# Patient Record
Sex: Female | Born: 1960 | Race: White | Hispanic: No | Smoking: Former smoker
Health system: Southern US, Community
[De-identification: ages and names within clinical notes are randomized; demographics above are authoritative.]

## PROBLEM LIST (undated history)

## (undated) DIAGNOSIS — E079 Disorder of thyroid, unspecified: Secondary | ICD-10-CM

## (undated) DIAGNOSIS — E785 Hyperlipidemia, unspecified: Secondary | ICD-10-CM

## (undated) HISTORY — PX: BREAST SURGERY: SHX581

## (undated) HISTORY — PX: THYROIDECTOMY: SHX17

## (undated) HISTORY — DX: Hyperlipidemia, unspecified: E78.5

---

## 1992-07-13 HISTORY — PX: TUBAL LIGATION: SHX77

## 1999-06-18 ENCOUNTER — Other Ambulatory Visit: Admission: RE | Admit: 1999-06-18 | Discharge: 1999-06-18 | Payer: Self-pay | Admitting: Gynecology

## 1999-07-08 ENCOUNTER — Encounter: Payer: Self-pay | Admitting: Gynecology

## 1999-07-08 ENCOUNTER — Encounter: Admission: RE | Admit: 1999-07-08 | Discharge: 1999-07-08 | Payer: Self-pay | Admitting: Gynecology

## 2001-11-07 ENCOUNTER — Other Ambulatory Visit: Admission: RE | Admit: 2001-11-07 | Discharge: 2001-11-07 | Payer: Self-pay | Admitting: Gynecology

## 2002-01-23 ENCOUNTER — Encounter: Payer: Self-pay | Admitting: Gynecology

## 2002-01-23 ENCOUNTER — Encounter: Admission: RE | Admit: 2002-01-23 | Discharge: 2002-01-23 | Payer: Self-pay | Admitting: Gynecology

## 2003-04-26 ENCOUNTER — Encounter: Payer: Self-pay | Admitting: Gynecology

## 2003-04-26 ENCOUNTER — Encounter: Admission: RE | Admit: 2003-04-26 | Discharge: 2003-04-26 | Payer: Self-pay | Admitting: Gynecology

## 2003-04-26 ENCOUNTER — Other Ambulatory Visit: Admission: RE | Admit: 2003-04-26 | Discharge: 2003-04-26 | Payer: Self-pay | Admitting: Gynecology

## 2004-07-11 ENCOUNTER — Encounter: Admission: RE | Admit: 2004-07-11 | Discharge: 2004-07-11 | Payer: Self-pay | Admitting: Family Medicine

## 2006-03-11 ENCOUNTER — Encounter: Admission: RE | Admit: 2006-03-11 | Discharge: 2006-03-11 | Payer: Self-pay | Admitting: Family Medicine

## 2007-07-14 HISTORY — PX: COLONOSCOPY: SHX174

## 2007-10-19 ENCOUNTER — Encounter: Admission: RE | Admit: 2007-10-19 | Discharge: 2007-10-19 | Payer: Self-pay | Admitting: Family Medicine

## 2007-10-20 ENCOUNTER — Other Ambulatory Visit: Admission: RE | Admit: 2007-10-20 | Discharge: 2007-10-20 | Payer: Self-pay | Admitting: Gynecology

## 2008-03-07 ENCOUNTER — Ambulatory Visit: Payer: Self-pay | Admitting: Gastroenterology

## 2008-03-07 DIAGNOSIS — D509 Iron deficiency anemia, unspecified: Secondary | ICD-10-CM | POA: Insufficient documentation

## 2008-03-07 DIAGNOSIS — K560 Paralytic ileus: Secondary | ICD-10-CM | POA: Insufficient documentation

## 2008-03-07 LAB — CONVERTED CEMR LAB: IgA: 258 mg/dL (ref 68–378)

## 2008-03-11 ENCOUNTER — Encounter: Payer: Self-pay | Admitting: Gastroenterology

## 2008-03-11 LAB — CONVERTED CEMR LAB: Tissue Transglutaminase Ab, IgA: 0.7 units (ref <7)

## 2008-04-20 ENCOUNTER — Ambulatory Visit: Payer: Self-pay | Admitting: Gastroenterology

## 2010-08-12 NOTE — Procedures (Signed)
Summary: Colonoscopy   Colonoscopy  Procedure date:  04/20/2008  Findings:      Location:  [facility].    Procedures Next Due Date:    Colonoscopy: 04/2018  Patient Name: Michelle Finley, Michelle Finley MRN:  Procedure Procedures: Colonoscopy CPT: 82956.  Personnel: Endoscopist: Rachael Fee, MD.  Referred By: Gaetano Hawthorne Lily Peer, MD.  Exam Location: Exam performed in Endoscopy Suite. Outpatient  Patient Consent: Procedure, Alternatives, Risks and Benefits discussed, consent obtained, from patient. Consent was obtained by the RN.  Indications  Evaluation of: Anemia with low ferritin.  History  Current Medications: Patient is not currently taking Coumadin.  Comments: Patient history reviewed and updated, pre-procedure physical performed prior to initiation of sedation? yes Pre-Exam Physical: Performed Apr 20, 2008. Cardio-pulmonary exam, Abdominal exam, Mental status exam WNL.  Exam Exam: Extent of exam reached: Cecum, extent intended: Cecum.  The cecum was identified by appendiceal orifice and IC valve. Patient position: on left side. Time to Cecum: 00:02:04. Time for Withdrawl: 00:08:03. Colon retroflexion performed. Images taken. ASA Classification: II. Tolerance: good.  Monitoring: Pulse and BP monitoring, Oximetry used. Supplemental O2 given.  Colon Prep Prep results: good.  Sedation Meds: Patient assessed and found to be appropriate for moderate (conscious) sedation. Fentanyl 75 mcg. given IV. Versed 6 mg. given IV.  Findings - NORMAL EXAM: Cecum to Rectum.   Assessment Normal examination.  Comments: NORMAL EXAMINATION.  NO POLYPS OR CANCERS.  SHOULD CONTINUE TO FOLLOW CURRENT COLORECTAL CANCER SCREENING GUIDELINES WITH A REPEAT COLONOSCOPY IN 10 YEARS. Events  Unplanned Interventions: No intervention was required.  Unplanned Events: There were no complications. Plans Comments: COLONOSCOPY IN 10 YEARS.   cc: Reynaldo Minium, MD      Lupe Carney, MD   This report was created from the original endoscopy report, which was reviewed and signed by the above listed endoscopist.

## 2010-08-12 NOTE — Letter (Signed)
Summary: Results Letter  Diagnostic & Radiology Imaging     , [place]    Phone:   Fax:         March 11, 2008 MRN: [id number]    [facility] [address] [place], [place]  [address]    Dear Ms. Weedman,   Your recent blood tests were all normal.  You do not have celiac sprue.  Please continue with the recommendations we previously discussed and feel free to call if you have any further questions or concerns.       Sincerely,  Rachael Fee MD  This letter has been electronically signed by your physician.  Appended Document: Results Letter letter mailed to pt

## 2010-08-12 NOTE — Assessment & Plan Note (Signed)
History of Present Illness Visit Type: consult Primary GI MD: Rob Bunting MD Primary Provider: Lupe Carney, MD Requesting Provider: Reynaldo Minium, MD Chief Complaint: anemia History of Present Illness:     very pleasant 50 year old woman  Saw OB gyn for heavy, prolonged menstral bleeding.  Found ot have fibroids and anemia.  She was started on iron supplement (has been intermittently on them in the past many years).  She was put on megace for menstral bleeding (took for a total of 3 bleeding). Bleeding stopped.   hemoglobin was in the 80s, low MCV. Iron testing showed iron of 19, ferritin of 4.  No rectal bleeding, no significant constipation or diarrhea.  Usually goes daily.  WEight has gone up by 15-20 pounds in past year.             Prior Medications Reviewed Using: Patient Recall  Updated Prior Medication List: SYNTHROID 125 MCG TABS (LEVOTHYROXINE SODIUM) Take 1 tablet by mouth once a day IRON 325 (65 FE) MG TABS (FERROUS SULFATE) Take 1 tablet by mouth two times a day  Current Allergies (reviewed today): No known allergies   Past Medical History:    Hypochromic microcytic anemia    Hypothyroidism    fibroids in uterus    Chronic anemia  Past Surgical History:    2 C-sections in 1994 and 1990    Thyroid removed in 1995   Family History:    no colon cancer family  Social History:    she is married, she has 2 children, she is a Designer, jewellery working on the EMR system at [employer], former smoker, drinks 0-1 alcoholic beverages a day,drinks one to 2 caffeinated beverages a day.    Review of Systems       Pertinent positive and negative review of systems were noted in the above HPI and GI specific review of systems.  All other review of systems was otherwise negative.    Vital Signs:  Patient Profile:   50 Years Old Female Height:     63 inches Weight:      188 pounds BMI:     33.42 Pulse rate:   64 / minute Pulse rhythm:   regular BP sitting:    122 / 84  (left arm) Cuff size:   regular  Vitals Entered By: Francee Piccolo CMA (March 07, 2008 10:42 AM)                  Physical Exam  Constitutional: generally well appearing Psychiatric: alert and oriented times 3 Eyes: extraocular movements intact Mouth: oropharynx moist, no lesions Neck: supple, no lymphadenopathy Cardiovascular: heart regular rate and rythm Lungs: CTA bilaterally Abdomen: soft, non-tender, non-distended, no obvious ascites, no peritoneal signs, normal bowel sounds Extremities: no lower extremity edema bilaterally Skin: no lesions on visible extremities     Impression & Recommendations:  Problem # 1:  ANEMIA-IRON DEFICIENCY (ICD-280.9) I suspect her anemia is mainly from menstrual losses, fibroids. She is 50 years old however and I think it is definitely prudent to arrange for her to have a full colonoscopy at her soonest convenience, make sure she does not have occult neoplasm which is adding to her iron deficiency.  celiac sprue testing will also be done as that can cause iron deficiency anemia.  Other Orders: TLB-IgA (Immunoglobulin A) ([id number]) T-Tissue Transglutamase Ab IgA ([id number])   Patient Instructions: 1)  You will be scheduled to have a colonoscopy. 2)  Continue daily iron (once to twice a day).  3)  You will get lab test(s) done (TTG, IgA).    ]  Appended Document: Orders Update    Clinical Lists Changes  Medications: Added new medication of MOVIPREP 100 GM  SOLR (PEG-KCL-NACL-NASULF-NA ASC-C) As per prep instructions. - Signed Rx of MOVIPREP 100 GM  SOLR (PEG-KCL-NACL-NASULF-NA ASC-C) As per prep instructions.;  #1 x 0;  Signed;  Entered by: Christie Nottingham CMA;  Authorized by: Rachael Fee MD;  Method used: Electronically to [facility]*, [address] Shipping/mailing, [place], [place]  [address], Ph: [phone], Fax: [phone] Orders: Added new Test order of Colonoscopy  (Colon) - Signed    Prescriptions: MOVIPREP 100 GM  SOLR (PEG-KCL-NACL-NASULF-NA ASC-C) As per prep instructions.  #1 x 0   Entered by:   Christie Nottingham CMA   Authorized by:   Rachael Fee MD   Signed by:   Christie Nottingham CMA on 03/07/2008   Method used:   Electronically to        [facility]* (retail)       [address]       [address] Shipping/mailing       [place], [place]  [address]       Ph: [phone]       Fax: [phone]   RxID:   [id number]

## 2011-01-09 ENCOUNTER — Other Ambulatory Visit: Payer: Self-pay | Admitting: Gynecology

## 2011-01-09 DIAGNOSIS — Z1231 Encounter for screening mammogram for malignant neoplasm of breast: Secondary | ICD-10-CM

## 2011-02-03 ENCOUNTER — Ambulatory Visit
Admission: RE | Admit: 2011-02-03 | Discharge: 2011-02-03 | Disposition: A | Discharge status: Home / Self Care | Payer: Commercial Managed Care - PPO | Source: Ambulatory Visit | Attending: Gynecology | Admitting: Gynecology

## 2011-02-03 DIAGNOSIS — Z1231 Encounter for screening mammogram for malignant neoplasm of breast: Secondary | ICD-10-CM

## 2011-06-11 ENCOUNTER — Emergency Department (HOSPITAL_COMMUNITY)
Admission: EM | Admit: 2011-06-11 | Discharge: 2011-06-11 | Disposition: A | Discharge status: Home / Self Care | Payer: Commercial Managed Care - PPO | Source: Home / Self Care | Attending: Emergency Medicine | Admitting: Emergency Medicine

## 2011-06-11 ENCOUNTER — Encounter: Payer: Self-pay | Admitting: *Deleted

## 2011-06-11 DIAGNOSIS — N3000 Acute cystitis without hematuria: Secondary | ICD-10-CM

## 2011-06-11 HISTORY — DX: Disorder of thyroid, unspecified: E07.9

## 2011-06-11 LAB — POCT URINALYSIS DIP (DEVICE)
Bilirubin Urine: NEGATIVE
Glucose, UA: NEGATIVE mg/dL
Ketones, ur: NEGATIVE mg/dL
Nitrite: POSITIVE — AB
Protein, ur: 100 mg/dL — AB
Specific Gravity, Urine: 1.02 (ref 1.005–1.030)
Urobilinogen, UA: 0.2 mg/dL (ref 0.0–1.0)
pH: 5.5 (ref 5.0–8.0)

## 2011-06-11 MED ORDER — CEPHALEXIN 500 MG PO CAPS: 500.0000 mg | ORAL_CAPSULE | Freq: Three times a day (TID) | ORAL | Status: AC

## 2011-06-11 MED ORDER — PHENAZOPYRIDINE HCL 200 MG PO TABS: 200.0000 mg | ORAL_TABLET | Freq: Three times a day (TID) | ORAL | Status: AC | PRN

## 2011-06-11 NOTE — ED Provider Notes (Signed)
History     CSN: [id number] Arrival date & time: 06/11/2011  2:40 PM   First MD Initiated Contact with Patient 06/11/11 1455      Chief Complaint  Patient presents with  . Urinary Tract Infection    (Consider location/radiation/quality/duration/timing/severity/associated sxs/prior treatment) HPI Comments: Michelle Finley has had a one-week history of low back pain, burning with urination, suprapubic pain, urinary frequency, and urgency. She denies any fever, chills, nausea, vomiting, hematuria, or GYN complaints. She has had no prior history of urinary tract infections.  Patient is a 50 y.o. female presenting with urinary tract infection.  Urinary Tract Infection Pertinent negatives include no abdominal pain.    Past Medical History  Diagnosis Date  . Thyroid disease     Past Surgical History  Procedure Date  . Cesarean section   . Thyroidectomy     Family History  Problem Relation Age of Onset  . Hypertension Mother   . Coronary artery disease Father     History  Substance Use Topics  . Smoking status: Never Smoker   . Smokeless tobacco: Not on file  . Alcohol Use: Yes     occasonally    OB History    Grav Para Term Preterm Abortions TAB SAB Ect Mult Living                  Review of Systems  Constitutional: Negative for fever and chills.  Gastrointestinal: Negative for nausea, vomiting and abdominal pain.  Genitourinary: Positive for dysuria, urgency, frequency and flank pain. Negative for hematuria, vaginal bleeding, vaginal discharge, vaginal pain, menstrual problem and pelvic pain.    Allergies  Review of patient's allergies indicates no known allergies.  Home Medications   Current Outpatient Rx  Name Route Sig Dispense Refill  . LEVOTHYROXINE SODIUM 125 MCG PO TABS Oral Take 125 mcg by mouth daily.      . CEPHALEXIN 500 MG PO CAPS Oral Take 1 capsule (500 mg total) by mouth 3 (three) times daily. 30 capsule 0  . PHENAZOPYRIDINE HCL 200 MG PO TABS Oral  Take 1 tablet (200 mg total) by mouth 3 (three) times daily as needed for pain. 15 tablet 0    BP 153/92  Pulse 80  Temp(Src) 98.5 F (36.9 C) (Oral)  Resp 17  SpO2 100%  Physical Exam  Nursing note and vitals reviewed. Constitutional: She appears well-developed and well-nourished. No distress.  Cardiovascular: Normal rate and regular rhythm.  Exam reveals no gallop and no friction rub.   No murmur heard. Pulmonary/Chest: Effort normal. No respiratory distress. She has no wheezes. She has no rales.  Abdominal: Soft. Bowel sounds are normal. She exhibits no distension and no mass. There is no hepatosplenomegaly. There is no tenderness. There is no rebound, no guarding and no CVA tenderness.  Skin: She is not diaphoretic.    ED Course  Procedures (including critical care time)  Results for orders placed during the hospital encounter of 06/11/11  POCT URINALYSIS DIP (DEVICE)      Component Value Range   Glucose, UA NEGATIVE  NEGATIVE (mg/dL)   Bilirubin Urine NEGATIVE  NEGATIVE    Ketones, ur NEGATIVE  NEGATIVE (mg/dL)   Specific Gravity, Urine 1.020  1.005 - 1.030    Hgb urine dipstick MODERATE (*) NEGATIVE    pH 5.5  5.0 - 8.0    Protein, ur 100 (*) NEGATIVE (mg/dL)   Urobilinogen, UA 0.2  0.0 - 1.0 (mg/dL)   Nitrite POSITIVE (*) NEGATIVE  Leukocytes, UA SMALL (*) NEGATIVE      Labs Reviewed  POCT URINALYSIS DIP (DEVICE) - Abnormal; Notable for the following:    Hgb urine dipstick MODERATE (*)    Protein, ur 100 (*)    Nitrite POSITIVE (*)    Leukocytes, UA SMALL (*) Biochemical Testing Only. Please order routine urinalysis from main lab if confirmatory testing is needed.   All other components within normal limits  URINE CULTURE   No results found.   1. Acute cystitis       MDM  She appears to have a urinary tract infection and we'll go ahead and treat with cephalexin. A urine culture was obtained.        Roque Lias, MD 06/11/11 2218

## 2011-06-11 NOTE — ED Notes (Signed)
Frequency ,burning, low back pain     Started  6  Days  Ago        No  Fever      -  Symptoms  notb  releived  By forced  Fluids also  Reports  Slight low abd  tenderness

## 2011-06-13 LAB — URINE CULTURE
Colony Count: >=100000
Culture  Setup Time: 201211292108

## 2011-06-15 NOTE — ED Notes (Signed)
Urine culture: E. Coli and medications (Keflex) reviewed.  Pt. adequately treated. Michelle Finley 06/15/2011

## 2012-04-15 ENCOUNTER — Other Ambulatory Visit: Payer: Self-pay | Admitting: Gynecology

## 2012-04-15 DIAGNOSIS — Z1231 Encounter for screening mammogram for malignant neoplasm of breast: Secondary | ICD-10-CM

## 2012-05-03 ENCOUNTER — Ambulatory Visit (INDEPENDENT_AMBULATORY_CARE_PROVIDER_SITE_OTHER): Payer: 59 | Admitting: Gynecology

## 2012-05-03 ENCOUNTER — Encounter: Payer: Self-pay | Admitting: Gynecology

## 2012-05-03 ENCOUNTER — Other Ambulatory Visit (HOSPITAL_COMMUNITY)
Admission: RE | Admit: 2012-05-03 | Discharge: 2012-05-03 | Disposition: A | Discharge status: Home / Self Care | Payer: 59 | Source: Ambulatory Visit | Attending: Gynecology | Admitting: Gynecology

## 2012-05-03 VITALS — BP 148/86 | Ht 65.0 in | Wt 196.0 lb

## 2012-05-03 DIAGNOSIS — Z01419 Encounter for gynecological examination (general) (routine) without abnormal findings: Secondary | ICD-10-CM

## 2012-05-03 DIAGNOSIS — Z1151 Encounter for screening for human papillomavirus (HPV): Secondary | ICD-10-CM | POA: Insufficient documentation

## 2012-05-03 NOTE — Progress Notes (Signed)
Michelle Finley April 15, 1961 [id number]        51 y.o.  [id number] for annual exam.  Doing well. Several issues noted below.  Past medical history,surgical history, medications, allergies, family history and social history were all reviewed and documented in the EPIC chart. ROS:  Was performed and pertinent positives and negatives are included in the history.  Exam: Fleet Contras assistant Filed Vitals:   05/03/12 1517  BP: 148/86  Height: 5\' 5"  (1.651 m)  Weight: 196 lb (88.905 kg)   General appearance  Normal Skin grossly normal Head/Neck normal with no cervical or supraclavicular adenopathy thyroid normal Lungs  clear Cardiac RR, without RMG Abdominal  soft, nontender, without masses, organomegaly or hernia Breasts  examined lying and sitting without masses, retractions, discharge or axillary adenopathy. Pelvic  Ext/BUS/vagina  normal   Cervix  normal Pap/HPV  Uterus  anteverted, normal size, shape and contour, midline and mobile nontender   Adnexa  Without masses or tenderness    Anus and perineum  normal   Rectovaginal  normal sphincter tone without palpated masses or tenderness.    Assessment/Plan:  51 y.o. [id number] female for annual exam.   1. Postmenopausal. Patients over one year without menses. Not having significant hot flashes/night sweats. We'll continue to monitor. Knows to report any bleeding or worsening menopausal symptoms. 2. Mild elevated blood pressure. Blood pressure 148/86. Repeated 140/80. Patient knows to have rechecked at a non-exam situation. If it would continue elevated she will see Dr. Clovis Riley her primary. 3. Pap smear. Pap/HPV done today. No history of abnormal Pap smears before. Assuming negative plan every 5 year screening. 4. Mammography. Patient due now and knows to schedule.  SBE monthly reviewed. 5. Colonoscopy. Patient had 2009. We'll repeat a 10 year interval per recommendation. 6. DEXA. We'll plan closer to 60. Increase calcium vitamin D  reviewed. 7. Health maintenance. The blood work done today as it's all done through Dr. Quita Skye office. Follow up one year, sooner as needed.    Dara Lords MD, 4:15 PM 05/03/2012

## 2012-05-03 NOTE — Patient Instructions (Signed)
Follow up in one year for annual gynecologic exam. 

## 2012-05-04 ENCOUNTER — Encounter: Payer: Self-pay | Admitting: Gynecology

## 2012-05-04 ENCOUNTER — Encounter: Payer: Self-pay | Admitting: Women's Health

## 2012-05-04 LAB — URINALYSIS W MICROSCOPIC + REFLEX CULTURE
Bacteria, UA: NONE SEEN
Bilirubin Urine: NEGATIVE
Casts: NONE SEEN
Crystals: NONE SEEN
Glucose, UA: NEGATIVE mg/dL
Hgb urine dipstick: NEGATIVE
Ketones, ur: NEGATIVE mg/dL
Leukocytes, UA: NEGATIVE
Nitrite: NEGATIVE
Protein, ur: NEGATIVE mg/dL
Specific Gravity, Urine: 1.012 (ref 1.005–1.030)
Squamous Epithelial / LPF: NONE SEEN
Urobilinogen, UA: 0.2 mg/dL (ref 0.0–1.0)
pH: 7.5 (ref 5.0–8.0)

## 2012-05-11 ENCOUNTER — Ambulatory Visit
Admission: RE | Admit: 2012-05-11 | Discharge: 2012-05-11 | Disposition: A | Discharge status: Home / Self Care | Payer: 59 | Source: Ambulatory Visit | Attending: Gynecology | Admitting: Gynecology

## 2012-05-11 DIAGNOSIS — Z1231 Encounter for screening mammogram for malignant neoplasm of breast: Secondary | ICD-10-CM

## 2012-07-20 ENCOUNTER — Encounter: Payer: Self-pay | Admitting: Gynecology

## 2012-08-27 ENCOUNTER — Other Ambulatory Visit: Payer: Self-pay

## 2013-05-18 ENCOUNTER — Other Ambulatory Visit: Payer: Self-pay

## 2013-08-03 ENCOUNTER — Encounter: Payer: 59 | Attending: Family Medicine | Admitting: Dietician

## 2013-08-03 ENCOUNTER — Encounter: Payer: Self-pay | Admitting: Dietician

## 2013-08-03 VITALS — Ht 64.0 in | Wt 186.0 lb

## 2013-08-03 DIAGNOSIS — Z6831 Body mass index (BMI) 31.0-31.9, adult: Secondary | ICD-10-CM | POA: Insufficient documentation

## 2013-08-03 DIAGNOSIS — Z713 Dietary counseling and surveillance: Secondary | ICD-10-CM | POA: Insufficient documentation

## 2013-08-03 DIAGNOSIS — E669 Obesity, unspecified: Secondary | ICD-10-CM | POA: Insufficient documentation

## 2013-08-03 NOTE — Patient Instructions (Signed)
Take some time on the weekend to plan meals for the week. Fill half of your plate with vegetables, have protein the size of the palm of your hand, and limit starch to a quarter of your plate.  Phase out soda/sweet tea (or work on having less at breakfast). Try to drink more water. Work on having something to eat within an hour of waking up (can be snack such nuts, fruit, cheese, crackers, protein bar). Plan to go out to dinner Thursday night - after your work out class. Try to pack lunch for work 3 x week. Or when going to the cafeteria for lunch, consider the salad with grilled chicken with balsamic vinaigrette.   Try splitting entrees or have them pack up half of dinner to bring home. Work on taking your time to eat meals (20 minutes). Consider adding snacks to help keep portion sizes smaller at meal times.

## 2013-08-03 NOTE — Progress Notes (Signed)
  Medical Nutrition Therapy:  Appt start time: 1500 end time:  0263.  Assessment:  Primary concerns today: Michelle Finley is a here today to learn about healthier eating. She is a Equities trader interested in weight loss and at the appointment with her husband. Michelle Finley does the food shopping and shares the food preparation with her husband. Their children have moved out of the house and state that they eat more meals out than before. Eats lunch at cafeteria most days during the week and will go out to dinner about 4 x weekend. Exercising 3-4 x week with her husband. States that she is a "fast eater".   Preferred Learning Style:   No preference indicated   Learning Readiness:   Ready  MEDICATIONS: see list    DIETARY INTAKE:  Usual eating pattern includes 2 meals and 1 snacks per day.  Avoided foods include okra, brussels sprouts, most seafood  24-hr recall:  B ( AM): skips, sometimes coffee (black)  Snk ( AM): cheese and crackers with coffee  L ( PM): sandwich from home or sometimes soup, salad, fruit bar with 1/2 soda regular or water  Snk ( PM): none D ( PM): at home - grilled chicken with salad, pasta, frozen pizza, sandwich or will spilt an entree if goes out and will have beer, milk, or water Snk ( PM): none Beverages: water, 2 large cups coffee, 1/2 soda maybe, 1% milk, beer   Usual physical activity: spin class 2 x week for 45 minutes, body pump 1 x week, looking to add aerobics on Monday, may walk on the weekend  Estimated energy needs: 1600 calories 180 g carbohydrates 120 g protein 44 g fat  Progress Towards Goal(s):  In progress.   Nutritional Diagnosis:  [place]-3.3 Overweight/obesity As related to hx of frequent snacking and restaurant meals.  As evidenced by BMI of 31.9.    Intervention:  Nutrition counseling provided.  Plan: Take some time on the weekend to plan meals for the week. Fill half of your plate with vegetables, have protein the size of the palm of your  hand, and limit starch to a quarter of your plate.  Phase out soda/sweet tea (or work on having less at breakfast). Try to drink more water. Work on having something to eat within an hour of waking up (can be snack such nuts, fruit, cheese, crackers, protein bar). Plan to go out to dinner Thursday night - after your work out class. Try to pack lunch for work 3 x week. Or when going to the cafeteria for lunch, consider the salad with grilled chicken with balsamic vinaigrette.   Try splitting entrees or have them pack up half of dinner to bring home. Work on taking your time to eat meals (20 minutes). Consider adding snacks to help keep portion sizes smaller at meal times.   Teaching Method Utilized:  Visual Auditory Hands on  Handouts given during visit include:  MyPlate Handout  15 g CHO Snacks  Barriers to learning/adherence to lifestyle change: busy schedule  Demonstrated degree of understanding via:  Teach Back   Monitoring/Evaluation:  Dietary intake, exercise, mindful eating, and body weight in 3 month(s).

## 2013-08-25 ENCOUNTER — Other Ambulatory Visit: Payer: Self-pay

## 2013-08-25 DIAGNOSIS — Z1231 Encounter for screening mammogram for malignant neoplasm of breast: Secondary | ICD-10-CM

## 2013-09-13 ENCOUNTER — Ambulatory Visit
Admission: RE | Admit: 2013-09-13 | Discharge: 2013-09-13 | Disposition: A | Discharge status: Home / Self Care | Payer: 59 | Source: Ambulatory Visit

## 2013-09-13 DIAGNOSIS — Z1231 Encounter for screening mammogram for malignant neoplasm of breast: Secondary | ICD-10-CM

## 2013-09-22 ENCOUNTER — Encounter: Payer: Self-pay | Admitting: Gynecology

## 2013-09-22 ENCOUNTER — Ambulatory Visit (INDEPENDENT_AMBULATORY_CARE_PROVIDER_SITE_OTHER): Payer: 59 | Admitting: Gynecology

## 2013-09-22 VITALS — BP 122/76 | Ht 64.0 in | Wt 186.0 lb

## 2013-09-22 DIAGNOSIS — Z01419 Encounter for gynecological examination (general) (routine) without abnormal findings: Secondary | ICD-10-CM

## 2013-09-22 NOTE — Progress Notes (Signed)
Michelle Finley 1961-01-16 [id number]        52 y.o.  [id number] for annual exam.  Doing well without complaints.  Past medical history,surgical history, problem list, medications, allergies, family history and social history were all reviewed and documented in the EPIC chart.  ROS:  Performed and pertinent positives and negatives are included in the history, assessment and plan .  Exam: Kim assistant Filed Vitals:   09/22/13 1056  BP: 122/76  Height: 5\' 4"  (1.626 m)  Weight: 186 lb (84.369 kg)   General appearance  Normal Skin grossly normal Head/Neck normal with no cervical or supraclavicular adenopathy thyroid normal Lungs  clear Cardiac RR, without RMG Abdominal  soft, nontender, without masses, organomegaly or hernia Breasts  examined lying and sitting without masses, retractions, discharge or axillary adenopathy. Pelvic  Ext/BUS/vagina normal with mild atrophic changes  Cervix normal  Uterus anteverted, normal size, shape and contour, midline and mobile nontender   Adnexa  Without masses or tenderness    Anus and perineum  Normal   Rectovaginal  Normal sphincter tone without palpated masses or tenderness.    Assessment/Plan:  53 y.o. [id number] female for annual exam.   1. Postmenopausal/atrophic genital changes. Patient's doing well without significant hot flushes, night sweats, vaginal dryness or dyspareunia. No vaginal bleeding. We'll continue to monitor. Call if any vaginal bleeding. 2. Mammography 09/2013. Continue with annual mammography. SBE monthly reviewed. 3. Pap smear 2013. No Pap smear done today. No history of abnormal Pap smears previously. Plan repeat Pap smear next year 3 year interval. 4. Colonoscopy 2009. Recommended repeat interval 10 years. 5. DEXA never. We'll plan further into the menopause. 6. Health maintenance. No blood work done as this is all done through her primary physician's office. Followup one year, sooner as needed.   Note: This document was  prepared with digital dictation and possible smart phrase technology. Any transcriptional errors that result from this process are unintentional.   [name], 11:22 AM 09/22/2013

## 2013-09-22 NOTE — Patient Instructions (Signed)
Followup in 1 year for annual exam, sooner as needed.  Health Maintenance, Female A healthy lifestyle and preventative care can promote health and wellness.  Maintain regular health, dental, and eye exams.  Eat a healthy diet. Foods like vegetables, fruits, whole grains, low-fat dairy products, and lean protein foods contain the nutrients you need without too many calories. Decrease your intake of foods high in solid fats, added sugars, and salt. Get information about a proper diet from your caregiver, if necessary.  Regular physical exercise is one of the most important things you can do for your health. Most adults should get at least 150 minutes of moderate-intensity exercise (any activity that increases your heart rate and causes you to sweat) each week. In addition, most adults need muscle-strengthening exercises on 2 or more days a week.   Maintain a healthy weight. The body mass index (BMI) is a screening tool to identify possible weight problems. It provides an estimate of body fat based on height and weight. Your caregiver can help determine your BMI, and can help you achieve or maintain a healthy weight. For adults 20 years and older:  A BMI below 18.5 is considered underweight.  A BMI of 18.5 to 24.9 is normal.  A BMI of 25 to 29.9 is considered overweight.  A BMI of 30 and above is considered obese.  Maintain normal blood lipids and cholesterol by exercising and minimizing your intake of saturated fat. Eat a balanced diet with plenty of fruits and vegetables. Blood tests for lipids and cholesterol should begin at age 67 and be repeated every 5 years. If your lipid or cholesterol levels are high, you are over 50, or you are a high risk for heart disease, you may need your cholesterol levels checked more frequently.Ongoing high lipid and cholesterol levels should be treated with medicines if diet and exercise are not effective.  If you smoke, find out from your caregiver how to  quit. If you do not use tobacco, do not start.  Lung cancer screening is recommended for adults aged 89 80 years who are at high risk for developing lung cancer because of a history of smoking. Yearly low-dose computed tomography (CT) is recommended for people who have at least a 30-pack-year history of smoking and are a current smoker or have quit within the past 15 years. A pack year of smoking is smoking an average of 1 pack of cigarettes a day for 1 year (for example: 1 pack a day for 30 years or 2 packs a day for 15 years). Yearly screening should continue until the smoker has stopped smoking for at least 15 years. Yearly screening should also be stopped for people who develop a health problem that would prevent them from having lung cancer treatment.  If you are pregnant, do not drink alcohol. If you are breastfeeding, be very cautious about drinking alcohol. If you are not pregnant and choose to drink alcohol, do not exceed 1 drink per day. One drink is considered to be 12 ounces (355 mL) of beer, 5 ounces (148 mL) of wine, or 1.5 ounces (44 mL) of liquor.  Avoid use of street drugs. Do not share needles with anyone. Ask for help if you need support or instructions about stopping the use of drugs.  High blood pressure causes heart disease and increases the risk of stroke. Blood pressure should be checked at least every 1 to 2 years. Ongoing high blood pressure should be treated with medicines, if weight loss  and exercise are not effective.  If you are 27 to 55 years old, ask your caregiver if you should take aspirin to prevent strokes.  Diabetes screening involves taking a blood sample to check your fasting blood sugar level. This should be done once every 3 years, after age 70, if you are within normal weight and without risk factors for diabetes. Testing should be considered at a younger age or be carried out more frequently if you are overweight and have at least 1 risk factor for  diabetes.  Breast cancer screening is essential preventative care for women. You should practice "breast self-awareness." This means understanding the normal appearance and feel of your breasts and may include breast self-examination. Any changes detected, no matter how small, should be reported to a caregiver. Women in their 15s and 30s should have a clinical breast exam (CBE) by a caregiver as part of a regular health exam every 1 to 3 years. After age 22, women should have a CBE every year. Starting at age 61, women should consider having a mammogram (breast X-ray) every year. Women who have a family history of breast cancer should talk to their caregiver about genetic screening. Women at a high risk of breast cancer should talk to their caregiver about having an MRI and a mammogram every year.  Breast cancer gene (BRCA)-related cancer risk assessment is recommended for women who have family members with BRCA-related cancers. BRCA-related cancers include breast, ovarian, tubal, and peritoneal cancers. Having family members with these cancers may be associated with an increased risk for harmful changes (mutations) in the breast cancer genes BRCA1 and BRCA2. Results of the assessment will determine the need for genetic counseling and BRCA1 and BRCA2 testing.  The Pap test is a screening test for cervical cancer. Women should have a Pap test starting at age 29. Between ages 107 and 50, Pap tests should be repeated every 2 years. Beginning at age 47, you should have a Pap test every 3 years as long as the past 3 Pap tests have been normal. If you had a hysterectomy for a problem that was not cancer or a condition that could lead to cancer, then you no longer need Pap tests. If you are between ages 47 and 29, and you have had normal Pap tests going back 10 years, you no longer need Pap tests. If you have had past treatment for cervical cancer or a condition that could lead to cancer, you need Pap tests and  screening for cancer for at least 20 years after your treatment. If Pap tests have been discontinued, risk factors (such as a new sexual partner) need to be reassessed to determine if screening should be resumed. Some women have medical problems that increase the chance of getting cervical cancer. In these cases, your caregiver may recommend more frequent screening and Pap tests.  The human papillomavirus (HPV) test is an additional test that may be used for cervical cancer screening. The HPV test looks for the virus that can cause the cell changes on the cervix. The cells collected during the Pap test can be tested for HPV. The HPV test could be used to screen women aged 19 years and older, and should be used in women of any age who have unclear Pap test results. After the age of 68, women should have HPV testing at the same frequency as a Pap test.  Colorectal cancer can be detected and often prevented. Most routine colorectal cancer screening begins at the  age of 72 and continues through age 75. However, your caregiver may recommend screening at an earlier age if you have risk factors for colon cancer. On a yearly basis, your caregiver may provide home test kits to check for hidden blood in the stool. Use of a small camera at the end of a tube, to directly examine the colon (sigmoidoscopy or colonoscopy), can detect the earliest forms of colorectal cancer. Talk to your caregiver about this at age 8, when routine screening begins. Direct examination of the colon should be repeated every 5 to 10 years through age 45, unless early forms of pre-cancerous polyps or small growths are found.  Hepatitis C blood testing is recommended for all people born from [birth year] through 1967 and any individual with known risks for hepatitis C.  Practice safe sex. Use condoms and avoid high-risk sexual practices to reduce the spread of sexually transmitted infections (STIs). Sexually active women aged 8 and younger should be  checked for Chlamydia, which is a common sexually transmitted infection. Older women with new or multiple partners should also be tested for Chlamydia. Testing for other STIs is recommended if you are sexually active and at increased risk.  Osteoporosis is a disease in which the bones lose minerals and strength with aging. This can result in serious bone fractures. The risk of osteoporosis can be identified using a bone density scan. Women ages 66 and over and women at risk for fractures or osteoporosis should discuss screening with their caregivers. Ask your caregiver whether you should be taking a calcium supplement or vitamin D to reduce the rate of osteoporosis.  Menopause can be associated with physical symptoms and risks. Hormone replacement therapy is available to decrease symptoms and risks. You should talk to your caregiver about whether hormone replacement therapy is right for you.  Use sunscreen. Apply sunscreen liberally and repeatedly throughout the day. You should seek shade when your shadow is shorter than you. Protect yourself by wearing long sleeves, pants, a wide-brimmed hat, and sunglasses year round, whenever you are outdoors.  Notify your caregiver of new moles or changes in moles, especially if there is a change in shape or color. Also notify your caregiver if a mole is larger than the size of a pencil eraser.  Stay current with your immunizations. Document Released: 01/12/2011 Document Revised: 10/24/2012 Document Reviewed: 01/12/2011 [facility] Patient Information 2014 [place].

## 2013-11-02 ENCOUNTER — Encounter: Payer: 59 | Attending: Family Medicine | Admitting: Dietician

## 2013-11-02 VITALS — Ht 64.0 in | Wt 185.4 lb

## 2013-11-02 DIAGNOSIS — Z713 Dietary counseling and surveillance: Secondary | ICD-10-CM | POA: Insufficient documentation

## 2013-11-02 DIAGNOSIS — E669 Obesity, unspecified: Secondary | ICD-10-CM | POA: Insufficient documentation

## 2013-11-02 DIAGNOSIS — Z6831 Body mass index (BMI) 31.0-31.9, adult: Secondary | ICD-10-CM | POA: Insufficient documentation

## 2013-11-02 NOTE — Patient Instructions (Signed)
Take some time on Friday to plan meals for Monday, Tuesday, and Wednesday.  Fill half of your plate with vegetables, have protein the size of the palm of your hand, and limit starch to a quarter of your plate.  Continue phase out soda. Try to drink more water. Work on having something to eat within an hour of waking up (can be snack such nuts, fruit, cheese, crackers, protein bar). Try to pack lunch for work 3 x week. (Pack night before.) Or when going to the cafeteria for lunch, consider the salad with grilled chicken with balsamic vinaigrette.   Try splitting entrees or have them pack up half of dinner to bring home. Work on taking your time to eat meals (20 minutes).

## 2013-11-02 NOTE — Progress Notes (Signed)
  Medical Nutrition Therapy:  Appt start time: 0737 end time:  1062.  Assessment:  Primary concerns today: Idelle returns today for weight loss with a 1 lb weight loss. Eating breakfast more now than before though going out to eat a lot since they are having difficulty finding time to plan meals. Trying to drink less soda.   Wt Readings from Last 3 Encounters:  11/02/13 185 lb 6.4 oz (84.097 kg)  09/22/13 186 lb (84.369 kg)  08/03/13 186 lb (84.369 kg)   Ht Readings from Last 3 Encounters:  11/02/13 5\' 4"  (1.626 m)  09/22/13 5\' 4"  (1.626 m)  08/03/13 5\' 4"  (1.626 m)   Body mass index is 31.81 kg/(m^2). @BMIFA @ Normalized weight-for-age data available only for age 44 to 20 years. Normalized stature-for-age data available only for age 44 to 30 years.  Preferred Learning Style:   No preference indicated   Learning Readiness:   Ready  MEDICATIONS: see list    DIETARY INTAKE:  Usual eating pattern includes 3 meals and 1 snacks per day.  Avoided foods include okra, brussels sprouts, most seafood  24-hr recall:  B ( AM): raisins and nuts or protein bar, sometimes coffee (black)  Snk ( AM): cheese and crackers with coffee  L ( PM): sandwich from home or sometimes cafeteria soup, salad, fruit bar with 1/2 soda regular (less often) or water  Snk ( PM): none D ( PM): at home - grilled chicken with salad, pasta, frozen pizza, sandwich or will spilt an entree if goes out and will have beer, milk, or water Snk ( PM): none Beverages: water, 2 large cups coffee, 1/2 soda maybe, 1% milk, beer   Usual physical activity: spin class 2 x week for 45 minutes, body pump 1 x week, looking to add aerobics on Monday, may walk on the weekend  Estimated energy needs: 1600 calories 180 g carbohydrates 120 g protein 44 g fat  Progress Towards Goal(s):  In progress.   Nutritional Diagnosis:  [place]-3.3 Overweight/obesity As related to hx of frequent snacking and restaurant meals.  As evidenced by  BMI of 31.9.    Intervention:  Nutrition counseling provided.  Plan: Take some time on Friday to plan meals for Monday, Tuesday, and Wednesday.  Fill half of your plate with vegetables, have protein the size of the palm of your hand, and limit starch to a quarter of your plate.  Continue phase out soda. Try to drink more water. Work on having something to eat within an hour of waking up (can be snack such nuts, fruit, cheese, crackers, protein bar). Try to pack lunch for work 3 x week. (Pack night before.) Or when going to the cafeteria for lunch, consider the salad with grilled chicken with balsamic vinaigrette.   Try splitting entrees or have them pack up half of dinner to bring home. Work on taking your time to eat meals (20 minutes).  Teaching Method Utilized:  Visual Auditory Hands on  Barriers to learning/adherence to lifestyle change: busy schedule  Demonstrated degree of understanding via:  Teach Back   Monitoring/Evaluation:  Dietary intake, exercise, mindful eating, and body weight in 4 month(s).

## 2014-03-08 ENCOUNTER — Ambulatory Visit: Payer: 59 | Admitting: Dietician

## 2014-04-18 ENCOUNTER — Encounter: Payer: 59 | Attending: Family Medicine | Admitting: Dietician

## 2014-04-18 VITALS — Ht 64.0 in | Wt 189.2 lb

## 2014-04-18 DIAGNOSIS — Z713 Dietary counseling and surveillance: Secondary | ICD-10-CM | POA: Insufficient documentation

## 2014-04-18 DIAGNOSIS — Z6832 Body mass index (BMI) 32.0-32.9, adult: Secondary | ICD-10-CM | POA: Diagnosis not present

## 2014-04-18 DIAGNOSIS — E669 Obesity, unspecified: Secondary | ICD-10-CM | POA: Diagnosis present

## 2014-04-18 NOTE — Patient Instructions (Signed)
Take some time on weekend to plan meals for Monday, Tuesday, and Wednesday.  Fill half of your plate with vegetables, have protein the size of the palm of your hand, and limit starch to a quarter of your plate.  Think about getting bagged salads and eat them! Continue phase out soda. Try to drink more water. Work on having something to eat within an hour of waking up (can be snack such nuts, fruit, cheese, crackers, protein bar). Keep packing lunch for work 3 x week.  Try splitting entrees or have them pack up half of dinner to bring home. Keeping working on taking your time to eat meals (20 minutes). Consider bring snacks can be snack such nuts, fruit, cheese, crackers, protein bar.

## 2014-04-18 NOTE — Progress Notes (Signed)
  Medical Nutrition Therapy:  Appt start time: 1500 end time:  4680.  Assessment:  Primary concerns today: Michelle Finley returns today for weight loss with a 4 lb weight gain. Still goes out to eat more than she should, but overall eating out less than before. Went to [place] for vacation for 2 week in September. Did some walking on vacation. Trying to bring sandwiches from home for lunch.   Eating breakfast more now than before though going out to eat a lot since they are having difficulty finding time to plan meals. Trying to drink less soda.  Wt Readings from Last 3 Encounters:  04/18/14 189 lb 3.2 oz (85.821 kg)  11/02/13 185 lb 6.4 oz (84.097 kg)  09/22/13 186 lb (84.369 kg)   Ht Readings from Last 3 Encounters:  04/18/14 5\' 4"  (1.626 m)  11/02/13 5\' 4"  (1.626 m)  09/22/13 5\' 4"  (1.626 m)   Body mass index is 32.46 kg/(m^2). @BMIFA @ Normalized weight-for-age data available only for age 59 to 33 years. Normalized stature-for-age data available only for age 59 to 88 years.   Preferred Learning Style:   No preference indicated   Learning Readiness:   Ready  MEDICATIONS: see list    DIETARY INTAKE:  Usual eating pattern includes 3 meals and 1 snacks per day.  Avoided foods include okra, brussels sprouts, most seafood  24-hr recall:  B ( AM): [place] breakfast biscuits, sometimes coffee (black)  Snk ( AM): cheese and crackers or popcorn with coffee  L ( PM): sandwich from home or sometimes cafeteria soup, salad, fruit bar with 1 soda regular (less often) or water  Snk ( PM): sometimes might have nuts or chips D ( PM): at home - grilled chicken with salad, chili, pasta, frozen pizza, sandwich or will spilt an entree if goes out and will have beer, milk, or water Snk ( PM): none Beverages: water, 2 large cups coffee, 1 soda per day, 1% milk, beer   Usual physical activity: spin class 2 x week for 45 minutes   Estimated energy needs: 1600 calories 180 g carbohydrates 120 g  protein 44 g fat  Progress Towards Goal(s):  In progress.   Nutritional Diagnosis:  [place]-3.3 Overweight/obesity As related to hx of frequent snacking and restaurant meals.  As evidenced by BMI of 31.9.    Intervention:  Nutrition counseling provided.  Plan: Take some time on Friday to plan meals for Monday, Tuesday, and Wednesday.  Fill half of your plate with vegetables, have protein the size of the palm of your hand, and limit starch to a quarter of your plate.  Continue phase out soda. Try to drink more water. Work on having something to eat within an hour of waking up (can be snack such nuts, fruit, cheese, crackers, protein bar). Try to pack lunch for work 3 x week. (Pack night before.) Or when going to the cafeteria for lunch, consider the salad with grilled chicken with balsamic vinaigrette.   Try splitting entrees or have them pack up half of dinner to bring home. Work on taking your time to eat meals (20 minutes).  Teaching Method Utilized:  Visual Auditory Hands on  Barriers to learning/adherence to lifestyle change: busy schedule  Demonstrated degree of understanding via:  Teach Back   Monitoring/Evaluation:  Dietary intake, exercise, mindful eating, and body weight in 3-4 month(s).

## 2014-05-14 ENCOUNTER — Encounter: Payer: Self-pay | Admitting: Gynecology

## 2014-07-25 ENCOUNTER — Ambulatory Visit: Payer: 59 | Admitting: Dietician

## 2014-08-30 ENCOUNTER — Encounter: Payer: 59 | Attending: Family Medicine | Admitting: Dietician

## 2014-08-30 VITALS — Wt 190.7 lb

## 2014-08-30 DIAGNOSIS — Z6832 Body mass index (BMI) 32.0-32.9, adult: Secondary | ICD-10-CM | POA: Insufficient documentation

## 2014-08-30 DIAGNOSIS — E669 Obesity, unspecified: Secondary | ICD-10-CM | POA: Diagnosis present

## 2014-08-30 DIAGNOSIS — Z713 Dietary counseling and surveillance: Secondary | ICD-10-CM | POA: Insufficient documentation

## 2014-08-30 NOTE — Progress Notes (Signed)
Medical Nutrition Therapy:  Appt start time: 330 end time:  400.  Assessment:  Primary concerns today: Michelle Finley returns today for weight loss with a 1 lb weight gain. Has been packing lunch about 2-3 week. Dinner planning sporadically on the weekend. Having chips with lunch which she feels like she would to cut out. Would like to cut back on eating so much meat. Still eating quickly and has a lot of snacks at office.   Still goes out to eat more than she should, but overall eating out less than before. Went to [place] for vacation for 2 week in September. Did some walking on vacation. Trying to bring sandwiches from home for lunch.   Eating breakfast more now than before though going out to eat a lot since they are having difficulty finding time to plan meals. Trying to drink less soda.  Wt Readings from Last 3 Encounters:  08/30/14 190 lb 11.2 oz (86.501 kg)  04/18/14 189 lb 3.2 oz (85.821 kg)  11/02/13 185 lb 6.4 oz (84.097 kg)   Ht Readings from Last 3 Encounters:  04/18/14 5\' 4"  (1.626 m)  11/02/13 5\' 4"  (1.626 m)  09/22/13 5\' 4"  (1.626 m)   Body mass index is 32.72 kg/(m^2). @BMIFA @ Normalized weight-for-age data available only for age 66 to 52 years. Normalized stature-for-age data available only for age 66 to 13 years.  Preferred Learning Style:   No preference indicated   Learning Readiness:   Ready  MEDICATIONS: see list    DIETARY INTAKE:  Usual eating pattern includes 3 meals and 1 snacks per day.  Avoided foods include okra, brussels sprouts, most seafood  24-hr recall:  B ( AM): Belvita breakfast biscuits, popcorn, sometimes coffee (black)  Snk ( AM): cheese and crackers or popcorn with coffee  L ( PM): sandwich from home or sometimes cafeteria soup, salad, fruit bar with 1 soda regular (less often) or water  Snk ( PM): sometimes might have nuts or chips D ( PM): at home - grilled chicken with salad, chili, pasta, frozen pizza, sandwich or will spilt an entree if  goes out and will have beer, milk, or water Snk ( PM): none Beverages: water, 2 large cups coffee, 3 soda per week, 1% milk, beer   Usual physical activity: spin class 2 x week for 45 minutes, trying to work in another day   Estimated energy needs: 1600 calories 180 g carbohydrates 120 g protein 44 g fat  Progress Towards Goal(s):  In progress.   Nutritional Diagnosis:  [place]-3.3 Overweight/obesity As related to hx of frequent snacking and restaurant meals.  As evidenced by BMI of 31.9.    Intervention:  Nutrition counseling provided.  Plan: Take some time on weekend to plan meals for 3 meals per week.  Fill half of your plate with vegetables, have protein the size of the palm of your hand, and limit starch to a quarter of your plate.  Think about getting bagged salads and eat them! Continue phase out soda. Try to drink more water. Hard boil some eggs to bring for breakfast, lunch, or snack. Work on having something to eat within an hour of waking up (can be snack such yogurt, nuts, fruit, cheese, crackers, protein bar). Keep packing lunch for work 3 x week.  Keeping working on taking your time to eat meals (20 minutes). Bring snacks can be snack such nuts, fruit, cheese, crackers, protein bar. Try to not eat while you are working (stop if you want a  snack).  Set a reminder to take food out to defrost in the morning.   Teaching Method Utilized:  Visual Auditory Hands on  Barriers to learning/adherence to lifestyle change: busy schedule  Demonstrated degree of understanding via:  Teach Back   Monitoring/Evaluation:  Dietary intake, exercise, mindful eating, and body weight in 3 month(s).

## 2014-08-30 NOTE — Patient Instructions (Addendum)
Take some time on weekend to plan meals for 3 meals per week.  Fill half of your plate with vegetables, have protein the size of the palm of your hand, and limit starch to a quarter of your plate.  Think about getting bagged salads and eat them! Continue phase out soda. Try to drink more water. Hard boil some eggs to bring for breakfast, lunch, or snack. Work on having something to eat within an hour of waking up (can be snack such yogurt, nuts, fruit, cheese, crackers, protein bar). Keep packing lunch for work 3 x week.  Keeping working on taking your time to eat meals (20 minutes). Bring snacks can be snack such nuts, fruit, cheese, crackers, protein bar. Try to not eat while you are working (stop if you want a snack).  Set a reminder to take food out to defrost in the morning.

## 2014-11-28 ENCOUNTER — Ambulatory Visit: Payer: 59 | Admitting: Dietician

## 2015-01-02 ENCOUNTER — Ambulatory Visit: Payer: 59 | Admitting: Dietician

## 2015-01-07 ENCOUNTER — Encounter: Payer: Self-pay | Admitting: Dietician

## 2015-01-07 ENCOUNTER — Encounter: Payer: 59 | Attending: Family Medicine | Admitting: Dietician

## 2015-01-07 VITALS — Ht 64.0 in | Wt 184.2 lb

## 2015-01-07 DIAGNOSIS — Z6831 Body mass index (BMI) 31.0-31.9, adult: Secondary | ICD-10-CM | POA: Diagnosis not present

## 2015-01-07 DIAGNOSIS — E669 Obesity, unspecified: Secondary | ICD-10-CM | POA: Insufficient documentation

## 2015-01-07 DIAGNOSIS — Z713 Dietary counseling and surveillance: Secondary | ICD-10-CM | POA: Insufficient documentation

## 2015-01-07 NOTE — Progress Notes (Signed)
  Medical Nutrition Therapy:  Appt start time: 345 end time:  400.  Assessment:  Primary concerns today: Michelle Finley returns today for weight loss with a 6 lb weight loss. Just got back from a week at the beach. Has been trying to eat better and healthier. Working on having more water and less snacks. Decided to no longer drink sodas. Has been having breakfast (fruit).    Wt Readings from Last 3 Encounters:  01/07/15 184 lb 3.2 oz (83.553 kg)  08/30/14 190 lb 11.2 oz (86.501 kg)  04/18/14 189 lb 3.2 oz (85.821 kg)   Ht Readings from Last 3 Encounters:  01/07/15 5\' 4"  (1.626 m)  04/18/14 5\' 4"  (1.626 m)  11/02/13 5\' 4"  (1.626 m)   Body mass index is 31.6 kg/(m^2). @BMIFA @ Normalized weight-for-age data available only for age 77 to 23 years. Normalized stature-for-age data available only for age 77 to 46 years.   Preferred Learning Style:   No preference indicated   Learning Readiness:   Ready  MEDICATIONS: see list    DIETARY INTAKE:  Usual eating pattern includes 3 meals and 1 snacks per day.  Avoided foods include okra, brussels sprouts, most seafood  24-hr recall:  B ( AM): fruit or bacon and eggs on the weekend sometimes coffee (black)  Snk ( AM): cheese and crackers or popcorn with coffee  L ( PM): sandwich from home or sometimes cafeteria soup, salad or Lean Cuisine with or water  Snk ( PM): sometimes might have fruit D ( PM): at home - grilled chicken with salad, chili, pasta, frozen pizza, sandwich or will spilt an entree if goes out and will have beer, milk, or water Snk ( PM): none Beverages: water, 2 large cups coffee 1% milk, beer   Usual physical activity: spin class 2 x week for 45 minutes, trying to work in another day   Estimated energy needs: 1600 calories 180 g carbohydrates 120 g protein 44 g fat  Progress Towards Goal(s):  In progress.   Nutritional Diagnosis:  [place]-3.3 Overweight/obesity As related to hx of frequent snacking and restaurant meals.  As  evidenced by BMI of 31.9.    Intervention:  Nutrition counseling provided.  Plan: Take some time on weekend to plan meals for 3 meals per week.  Think about getting bagged salads and eat them! Continue to cut out soda. Keep packing lunch for work 3 x week.  Keeping working on taking your time to eat meals (20 minutes). Bring snacks can be snack such nuts, fruit, cheese, crackers, protein bar, hard boiled eggs, peanut butter, hummus. Try to not eat while you are working (stop if you want a snack).  Set a reminder to take food out to defrost in the morning.  Continue to look into vegan options 3 x week.  Teaching Method Utilized:  Visual Auditory Hands on  Barriers to learning/adherence to lifestyle change: busy schedule  Demonstrated degree of understanding via:  Teach Back   Monitoring/Evaluation:  Dietary intake, exercise, mindful eating, and body weight in 3 month(s).

## 2015-01-07 NOTE — Patient Instructions (Addendum)
Take some time on weekend to plan meals for 3 meals per week.  Think about getting bagged salads and eat them! Continue to cut out soda. Keep packing lunch for work 3 x week.  Keeping working on taking your time to eat meals (20 minutes). Bring snacks can be snack such nuts, fruit, cheese, crackers, protein bar, hard boiled eggs, peanut butter, hummus. Try to not eat while you are working (stop if you want a snack).  Set a reminder to take food out to defrost in the morning.  Continue to look into vegan options 3 x week.

## 2015-04-17 ENCOUNTER — Encounter: Payer: Self-pay | Admitting: Dietician

## 2015-04-17 ENCOUNTER — Encounter: Payer: 59 | Attending: Family Medicine | Admitting: Dietician

## 2015-04-17 VITALS — Wt 186.0 lb

## 2015-04-17 DIAGNOSIS — Z713 Dietary counseling and surveillance: Secondary | ICD-10-CM | POA: Insufficient documentation

## 2015-04-17 DIAGNOSIS — E669 Obesity, unspecified: Secondary | ICD-10-CM | POA: Insufficient documentation

## 2015-04-17 DIAGNOSIS — Z6831 Body mass index (BMI) 31.0-31.9, adult: Secondary | ICD-10-CM | POA: Insufficient documentation

## 2015-04-17 NOTE — Patient Instructions (Addendum)
Take some time on weekend to plan meals for 3 meals per week.  Plan to pack lunch for work at least 3 x week. Think about getting bagged salads to add to lunch meal.  Keeping working on taking your time to eat meals (20 minutes). Look into adding more vegetables to meals (vegan/low sodium options).  Plan to walk 1 night on weekend and 2 nights during the week.

## 2015-04-17 NOTE — Progress Notes (Signed)
  Medical Nutrition Therapy:  Appt start time: 345 end time:  400.  Assessment:  Primary concerns today: Michelle Finley returns today for weight loss with a 2  lb weight gain. Just got back from vacation in [place]. No longer having soda. Hiked a lot on vacation. Gets home and has not been able to walk at night as planned. Doing some sporadic meal planning (getting better). Not bring lunch as much as before.   Son has moved out which makes meal planning easier.  Preferred Learning Style:   No preference indicated   Learning Readiness:   Ready  MEDICATIONS: see list    DIETARY INTAKE:  Usual eating pattern includes 3 meals and 1 snacks per day.  Avoided foods include okra, brussels sprouts, most seafood  24-hr recall:  B ( AM): peanut butter and pretzel chips or bacon and eggs on the weekend sometimes coffee (black)  Snk ( AM): cheese and crackers or popcorn with coffee  L ( PM): sandwich from home or sometimes cafeteria soup, salad or Lean Cuisine with with chips or water  Snk ( PM): pretzel chips sometimes  D ( PM): at home - grilled chicken with salad, chili, pasta, frozen pizza, sandwich or will spilt an entree if goes out and will have beer, milk, or water Snk ( PM): none Beverages: water, 2 large cups coffee 1% milk, beer   Usual physical activity: spin class 2 x week for 45 minutes, trying to work in another day   Estimated energy needs: 1600 calories 180 g carbohydrates 120 g protein 44 g fat  Progress Towards Goal(s):  In progress.   Nutritional Diagnosis:  [place]-3.3 Overweight/obesity As related to hx of frequent snacking and restaurant meals.  As evidenced by BMI of 31.9.    Intervention:  Nutrition counseling provided.  Plan: Take some time on weekend to plan meals for 3 meals per week.  Plan to pack lunch for work at least 3 x week. Think about getting bagged salads to add to lunch meal.  Keeping working on taking your time to eat meals (20 minutes). Look into  adding more vegetables to meals (vegan/low sodium options).  Plan to walk 1 night on weekend and 2 nights during the week.  Teaching Method Utilized:  Visual Auditory Hands on  Barriers to learning/adherence to lifestyle change: busy schedule  Demonstrated degree of understanding via:  Teach Back   Monitoring/Evaluation:  Dietary intake, exercise, mindful eating, and body weight in 3 month(s).

## 2015-06-05 ENCOUNTER — Ambulatory Visit: Payer: 59 | Admitting: Dietician

## 2015-07-30 ENCOUNTER — Encounter: Payer: Self-pay | Admitting: Skilled Nursing Facility1

## 2015-07-30 ENCOUNTER — Encounter: Payer: 59 | Attending: Family Medicine | Admitting: Skilled Nursing Facility1

## 2015-07-30 VITALS — Ht 66.0 in | Wt 185.0 lb

## 2015-07-30 DIAGNOSIS — Z713 Dietary counseling and surveillance: Secondary | ICD-10-CM | POA: Diagnosis not present

## 2015-07-30 DIAGNOSIS — E669 Obesity, unspecified: Secondary | ICD-10-CM | POA: Diagnosis not present

## 2015-07-30 DIAGNOSIS — Z6829 Body mass index (BMI) 29.0-29.9, adult: Secondary | ICD-10-CM | POA: Diagnosis not present

## 2015-07-30 NOTE — Progress Notes (Signed)
  Medical Nutrition Therapy:  Appt start time: 300 end time:  330.  Assessment:  Primary concerns today: Michelle Finley returns today having maintained wt since October of last year. Pt states she and her husband still eat about 3 meals outside of the home but they eat lunch from home now. Follow up from October accompanied by his wife. Pt seems uninterested and states she knows what to do, with recommendations offered the answer of "we have busy schedules". Pt states she cut out soda and chips starting January 1st,  2017 and does not understand why she has not lost weight yet.  Preferred Learning Style:   No preference indicated   Learning Readiness:   Ready  MEDICATIONS: see list    DIETARY INTAKE:  Usual eating pattern includes 3 meals and 1 snacks per day.  Avoided foods include okra, brussels sprouts, most seafood  24-hr recall:  B ( AM): peanut butter and pretzel chips or bacon and eggs on the weekend sometimes coffee (black)  Snk ( AM): cheese and crackers or popcorn with coffee  L ( PM): sandwich from home or sometimes cafeteria soup, salad or Lean Cuisine with with chips or water  Snk ( PM): pretzel chips sometimes  D ( PM): at home - grilled chicken with salad, chili, pasta, frozen pizza, sandwich or will spilt an entree if goes out and will have beer, milk, or water Snk ( PM): none Beverages: water, 2 large cups coffee 1% milk, beer   Usual physical activity: spin class 2 x week for 45 minutes, trying to work in another day   Estimated energy needs: 1600 calories 180 g carbohydrates 120 g protein 44 g fat  Progress Towards Goal(s):  In progress.   Nutritional Diagnosis:  [place]-3.3 Overweight/obesity As related to hx of frequent snacking and restaurant meals.  As evidenced by BMI of 31.9.    Intervention:  Nutrition counseling provided.  Plan: Take some time on weekend to plan meals for 3 meals per week.  Plan to pack lunch for work at least 3 x week. Think about getting  bagged salads to add to lunch meal.  Keeping working on taking your time to eat meals (20 minutes). Look into adding more vegetables to meals (vegan/low sodium options).  Plan to walk 1 night on weekend and 2 nights during the week.  Teaching Method Utilized:  Visual Auditory  Barriers to learning/adherence to lifestyle change: busy schedule  Demonstrated degree of understanding via:  Teach Back   Monitoring/Evaluation:  Dietary intake, exercise, mindful eating, and body weight in 3 month(s).

## 2015-08-23 DIAGNOSIS — E7800 Pure hypercholesterolemia, unspecified: Secondary | ICD-10-CM | POA: Diagnosis not present

## 2015-08-23 DIAGNOSIS — E039 Hypothyroidism, unspecified: Secondary | ICD-10-CM | POA: Diagnosis not present

## 2015-08-27 MED FILL — LEVOTHYROXINE 125 MCG TAB: 125 | 90 days supply | Qty: 90 | Fill #3

## 2015-09-18 MED FILL — ROSUVASTATIN CALCIUM 10 MG: 10 | 28 days supply | Qty: 12 | Fill #0

## 2015-10-10 MED FILL — ROSUVASTATIN CALCIUM 10 MG: 10 | 90 days supply | Qty: 39 | Fill #1

## 2015-10-30 ENCOUNTER — Encounter: Payer: 59 | Attending: Family Medicine | Admitting: Dietician

## 2015-10-30 ENCOUNTER — Encounter: Payer: Self-pay | Admitting: Dietician

## 2015-10-30 VITALS — Wt 185.1 lb

## 2015-10-30 DIAGNOSIS — Z713 Dietary counseling and surveillance: Secondary | ICD-10-CM | POA: Insufficient documentation

## 2015-10-30 DIAGNOSIS — E669 Obesity, unspecified: Secondary | ICD-10-CM

## 2015-10-30 NOTE — Patient Instructions (Signed)
Take some time on weekend (Sunday) to plan meals for 3 meals per week.  Continue packing lunch 2-3 x week.  Keeping working on taking your time to eat meals (20 minutes). Look into adding more vegetables to meals (vegan/low sodium options).

## 2015-10-30 NOTE — Progress Notes (Signed)
  Medical Nutrition Therapy:  Appt start time: 300 end time:  315.  Assessment:  Primary concerns today: Michelle Finley returns today for no weight change. Lost some weight on home scale. Not having chips at home and cut out sodas for the most part. Not snacking as much as before. Started doing yoga/pilates and walking at night. Just got back from [facility].   Preferred Learning Style:   No preference indicated   Learning Readiness:   Ready  MEDICATIONS: see list    DIETARY INTAKE:  Usual eating pattern includes 3 meals and 1 snacks per day.  Avoided foods include okra, brussels sprouts, most seafood  24-hr recall:  B ( AM): peanut butter and pretzel chips or bacon and toast sometimes coffee (black)  Snk ( AM): none  L ( PM): sandwich from home or sometimes cafeteria soup, salad or Lean Cuisine with or water  Snk ( PM):none D ( PM): at home - grilled chicken with salad, chili, pasta, frozen pizza, sandwich or will spilt an entree if goes out and will have beer, milk, or water Snk ( PM): none Beverages: water, 2 large cups coffee 1% milk, beer   Usual physical activity: spin class 2 x week for 45 minutes, 1 yoga/pilates, walking 2-3 x week  Estimated energy needs: 1600 calories 180 g carbohydrates 120 g protein 44 g fat  Progress Towards Goal(s):  In progress.   Nutritional Diagnosis:  [place]-3.3 Overweight/obesity As related to hx of frequent snacking and restaurant meals.  As evidenced by BMI of 31.9.    Intervention:  Nutrition counseling provided.  Plan: Take some time on weekend (Sunday) to plan meals for 3 meals per week.  Continue packing lunch 2-3 x week.  Keeping working on taking your time to eat meals (20 minutes). Look into adding more vegetables to meals (vegan/low sodium options).   Teaching Method Utilized:  Visual Auditory Hands on  Barriers to learning/adherence to lifestyle change: busy schedule  Demonstrated degree of understanding via:  Teach Back    Monitoring/Evaluation:  Dietary intake, exercise, mindful eating, and body weight in 3 month(s).

## 2015-11-29 MED FILL — LEVOTHYROXINE 125 MCG TAB: 125 | 90 days supply | Qty: 90 | Fill #0

## 2016-01-22 MED FILL — ROSUVASTATIN CALCIUM 10 MG: 10 | 90 days supply | Qty: 39 | Fill #2

## 2016-01-29 ENCOUNTER — Encounter: Payer: Self-pay | Admitting: Dietician

## 2016-01-29 ENCOUNTER — Encounter: Payer: 59 | Attending: Family Medicine | Admitting: Dietician

## 2016-01-29 VITALS — Wt 182.7 lb

## 2016-01-29 DIAGNOSIS — E669 Obesity, unspecified: Secondary | ICD-10-CM

## 2016-01-29 DIAGNOSIS — Z713 Dietary counseling and surveillance: Secondary | ICD-10-CM | POA: Insufficient documentation

## 2016-01-29 NOTE — Progress Notes (Signed)
  Medical Nutrition Therapy:  Appt start time: 400 end time:  415  Assessment:  Primary concerns today: Michelle Finley returns today for with a 3 lb weight loss. Moved office to new building which has a gym. Overall has been exercising more. Has not been going out to eat much lately.   Preferred Learning Style:   No preference indicated   Learning Readiness:   Ready  MEDICATIONS: see list    DIETARY INTAKE:  Usual eating pattern includes 3 meals and 1 snacks per day.  Avoided foods include okra, brussels sprouts, most seafood  24-hr recall:  B ( AM): peanut butter and pretzel chips or bacon and toast sometimes coffee (black)  Snk ( AM): none  L ( PM): sandwich from home with fruit or sometimes or [employer] 1-2 x mont Snk ( PM):none D ( PM): at home - grilled chicken with salad, chili, pasta, frozen pizza, sandwich or will spilt an entree if goes out and will have beer, or water Snk ( PM): none Beverages: water, 2 large cups coffee, soda on weekends beer   Usual physical activity: spin class  x week for 45 minutes, 1 yoga/pilates, walking 2-3 x week, working out 30 minutes on the stepper  Estimated energy needs: 1600 calories 180 g carbohydrates 120 g protein 44 g fat  Progress Towards Goal(s):  In progress.   Nutritional Diagnosis:  [place]-3.3 Overweight/obesity As related to hx of frequent snacking and restaurant meals.  As evidenced by BMI of 31.9.    Intervention:  Nutrition counseling provided.  Plan: Continue to plan meals for 3 meals per week.  Continue packing lunch most days of the week.  Keeping working on taking your time to eat meals. Look into adding more vegetables to meals (vegan/low sodium options).  Look into Bitemeals.com for convenient meal options. Or Try Hello Fresh or [employer] for meal ideas/planning ideas with more vegetables.   Teaching Method Utilized:  Visual Auditory Hands on  Barriers to learning/adherence to lifestyle change: busy  schedule  Demonstrated degree of understanding via:  Teach Back   Monitoring/Evaluation:  Dietary intake, exercise, mindful eating, and body weight in 3 month(s).

## 2016-01-29 NOTE — Patient Instructions (Signed)
Plan: Continue to plan meals for 3 meals per week.  Continue packing lunch most days of the week.  Keeping working on taking your time to eat meals. Look into adding more vegetables to meals (vegan/low sodium options).  Look into [employer] for convenient meal options. Or Try Hello Fresh or [employer] for meal ideas/planning ideas with more vegetables.

## 2016-02-20 ENCOUNTER — Other Ambulatory Visit: Payer: Self-pay | Admitting: Gynecology

## 2016-02-20 DIAGNOSIS — Z1231 Encounter for screening mammogram for malignant neoplasm of breast: Secondary | ICD-10-CM

## 2016-02-26 MED FILL — LEVOTHYROXINE 125 MCG TAB: 125 | 90 days supply | Qty: 90 | Fill #1

## 2016-03-20 DIAGNOSIS — E7800 Pure hypercholesterolemia, unspecified: Secondary | ICD-10-CM | POA: Diagnosis not present

## 2016-04-15 ENCOUNTER — Ambulatory Visit (INDEPENDENT_AMBULATORY_CARE_PROVIDER_SITE_OTHER): Payer: 59 | Admitting: Gynecology

## 2016-04-15 ENCOUNTER — Encounter: Payer: Self-pay | Admitting: Gynecology

## 2016-04-15 VITALS — BP 130/76 | Ht 65.0 in | Wt 186.0 lb

## 2016-04-15 DIAGNOSIS — Z01419 Encounter for gynecological examination (general) (routine) without abnormal findings: Secondary | ICD-10-CM | POA: Diagnosis not present

## 2016-04-15 NOTE — Patient Instructions (Signed)

## 2016-04-15 NOTE — Progress Notes (Signed)
    Michelle Finley Jun 19, 1961 [id number]        55 y.o.  [id number]  for annual exam.  Doing well without complaints.  Past medical history,surgical history, problem list, medications, allergies, family history and social history were all reviewed and documented as reviewed in the EPIC chart.  ROS:  Performed with pertinent positives and negatives included in the history, assessment and plan.   Additional significant findings :  None   Exam: Caryn Bee assistant Vitals:   04/15/16 1046  BP: 130/76  Weight: 186 lb (84.4 kg)  Height: 5\' 5"  (1.651 m)   Body mass index is 30.95 kg/m.  General appearance:  Normal affect, orientation and appearance. Skin: Grossly normal HEENT: Without gross lesions.  No cervical or supraclavicular adenopathy. Thyroid normal.  Lungs:  Clear without wheezing, rales or rhonchi Cardiac: RR, without RMG Abdominal:  Soft, nontender, without masses, guarding, rebound, organomegaly or hernia Breasts:  Examined lying and sitting without masses, retractions, discharge or axillary adenopathy. Pelvic:  Ext, BUS, Vagina normal with atrophic changes  Cervi normal with atrophic changes   Uterus antevertedrmal size, shape and contour, midline and mobile nontender   Adnexa without masses or tenderness    Anus and perineum normal   Rectovaginal normal sphincter tone without palpated masses or tenderness.    Assessment/Plan:  55 y.o. [id number] female for annual exam.   1. Postmenopausal/atrophic genital changes. No significant hot flushes, night sweats, vaginal dryness or any vaginal bleeding. Continue monitor report any issues or vaginal bleeding. 2. Mammography 2015. Patient has scheduled and will follow up for this. SBE monthly reviewed. 3. Pap smear/HPV 2013 negative. No Pap smear done today. No history of abnormal Pap smears. Will plan repeat Pap smear next year at 5 year interval per current screening guidelines. 4. Colonoscopy 2009. Reported repeat interval 10  years. 5. Health maintenance. No routine lab work done as patient has this done elsewhere. Follow up 1 year, sooner as needed.    [name], 11:21 AM 04/15/2016

## 2016-04-21 ENCOUNTER — Ambulatory Visit
Admission: RE | Admit: 2016-04-21 | Discharge: 2016-04-21 | Disposition: A | Discharge status: Home / Self Care | Payer: 59 | Source: Ambulatory Visit | Attending: Gynecology | Admitting: Gynecology

## 2016-04-21 DIAGNOSIS — Z1231 Encounter for screening mammogram for malignant neoplasm of breast: Secondary | ICD-10-CM

## 2016-04-21 MED FILL — ROSUVASTATIN CALCIUM 10 MG: 10 | 90 days supply | Qty: 39 | Fill #3

## 2016-04-30 ENCOUNTER — Ambulatory Visit: Payer: 59 | Admitting: Dietician

## 2016-05-21 ENCOUNTER — Encounter: Payer: 59 | Attending: Family Medicine | Admitting: Dietician

## 2016-05-21 DIAGNOSIS — Z713 Dietary counseling and surveillance: Secondary | ICD-10-CM | POA: Insufficient documentation

## 2016-05-21 NOTE — Patient Instructions (Addendum)
Plan: Continue to plan meals for 3 meals per week.  Continue packing lunch most days of the week.  Look into adding more vegetables to meals.  Cut out soda.  Work on Freight forwarder out chips at lunch on weekends.  Get back to exercising regularly as your ankle heals.  Look into B[employer].com for convenient meal options. Or Try Hello Fresh or [employer] for meal ideas/planning ideas with more vegetables.

## 2016-05-21 NOTE — Progress Notes (Signed)
  Medical Nutrition Therapy:  Appt start time: 230 end time:  255  Assessment:  Primary concerns today: Arnisha returns today for with a 5 lb weight gain. Twisted her ankle a few weeks ago and hasn't been able to exercise. Was using stepper at office gym and walking for exercise before injury. Started drinking one soda per day.  Overall planning more meals ahead of time.   Preferred Learning Style:   No preference indicated   Learning Readiness:   Ready  MEDICATIONS: see list    DIETARY INTAKE:  Usual eating pattern includes 3 meals and 1 snacks per day.  Avoided foods include okra, brussels sprouts, most seafood  24-hr recall:  B ( AM): peanut butter and pretzel chips or bacon and toast sometimes coffee (black)  Snk ( AM): none  L ( PM): sandwich from home with fruit or sometimes or [employer] 1-2 x mont Snk ( PM):none D ( PM): at home - grilled chicken with salad, chili, pasta, pizza, sandwich or will spilt an entree if goes out and will have beer, or water Snk ( PM): none Beverages: water, 2 large cups coffee, soda on weekends beer   Usual physical activity: stretching on her own at home, walking 2-3 x week, working out 30 minutes on the stepper  Estimated energy needs: 1600 calories 180 g carbohydrates 120 g protein 44 g fat  Progress Towards Goal(s):  In progress.   Nutritional Diagnosis:  [place]-3.3 Overweight/obesity As related to hx of frequent snacking and restaurant meals.  As evidenced by BMI of 31.9.    Intervention:  Nutrition counseling provided.  Plan: Continue to plan meals for 3 meals per week.  Continue packing lunch most days of the week.  Look into adding more vegetables to meals.  Cut out soda.  Work on Freight forwarder out chips at lunch on weekends.  Get back to exercising regularly as your ankle heals.  Look into B[employer] for convenient meal options. Or Try Hello Fresh or [employer] for meal ideas/planning ideas with more vegetables.   Teaching Method  Utilized:  Visual Auditory Hands on  Barriers to learning/adherence to lifestyle change: busy schedule  Demonstrated degree of understanding via:  Teach Back   Monitoring/Evaluation:  Dietary intake, exercise, mindful eating, and body weight in 4 month(s).

## 2016-06-16 ENCOUNTER — Other Ambulatory Visit: Payer: Self-pay | Admitting: Physician Assistant

## 2016-06-16 ENCOUNTER — Ambulatory Visit
Admission: RE | Admit: 2016-06-16 | Discharge: 2016-06-16 | Disposition: A | Discharge status: Home / Self Care | Payer: 59 | Source: Ambulatory Visit | Attending: Physician Assistant | Admitting: Physician Assistant

## 2016-06-16 DIAGNOSIS — M7989 Other specified soft tissue disorders: Secondary | ICD-10-CM | POA: Diagnosis not present

## 2016-06-16 DIAGNOSIS — M25572 Pain in left ankle and joints of left foot: Secondary | ICD-10-CM | POA: Diagnosis not present

## 2016-06-16 DIAGNOSIS — T1490XA Injury, unspecified, initial encounter: Secondary | ICD-10-CM

## 2016-06-16 DIAGNOSIS — M79672 Pain in left foot: Secondary | ICD-10-CM | POA: Diagnosis not present

## 2016-06-17 DIAGNOSIS — H52223 Regular astigmatism, bilateral: Secondary | ICD-10-CM | POA: Diagnosis not present

## 2016-06-17 DIAGNOSIS — H524 Presbyopia: Secondary | ICD-10-CM | POA: Diagnosis not present

## 2016-06-23 MED FILL — LEVOTHYROXINE 125 MCG TABLE: 125 | 90 days supply | Qty: 90 | Fill #2

## 2016-07-09 MED FILL — ROSUVASTATIN CALCIUM 10 MG: 10 | 90 days supply | Qty: 39 | Fill #4

## 2016-08-24 DIAGNOSIS — E039 Hypothyroidism, unspecified: Secondary | ICD-10-CM | POA: Diagnosis not present

## 2016-08-24 DIAGNOSIS — E7800 Pure hypercholesterolemia, unspecified: Secondary | ICD-10-CM | POA: Diagnosis not present

## 2016-08-24 DIAGNOSIS — Z1211 Encounter for screening for malignant neoplasm of colon: Secondary | ICD-10-CM | POA: Diagnosis not present

## 2016-09-23 ENCOUNTER — Ambulatory Visit: Payer: 59 | Admitting: Dietician

## 2016-10-08 MED FILL — LEVOTHYROXINE 125 MCG TABLE: 125 | 90 days supply | Qty: 90 | Fill #3

## 2017-01-20 MED FILL — ROSUVASTATIN CALCIUM 10 MG: 10 | 90 days supply | Qty: 39 | Fill #0

## 2017-01-21 MED FILL — LEVOTHYROXINE 125 MCG TABLE: 125 | 90 days supply | Qty: 90 | Fill #0

## 2017-04-19 MED FILL — LEVOTHYROXINE 125 MCG TABLE: 125 | 90 days supply | Qty: 90 | Fill #1

## 2017-06-18 DIAGNOSIS — H52223 Regular astigmatism, bilateral: Secondary | ICD-10-CM | POA: Diagnosis not present

## 2017-06-18 DIAGNOSIS — H524 Presbyopia: Secondary | ICD-10-CM | POA: Diagnosis not present

## 2017-07-14 MED FILL — ROSUVASTATIN CALCIUM 10 MG: 10 | 90 days supply | Qty: 39 | Fill #1

## 2017-08-26 DIAGNOSIS — Z0000 Encounter for general adult medical examination without abnormal findings: Secondary | ICD-10-CM | POA: Diagnosis not present

## 2017-08-26 DIAGNOSIS — E039 Hypothyroidism, unspecified: Secondary | ICD-10-CM | POA: Diagnosis not present

## 2017-08-26 DIAGNOSIS — E7800 Pure hypercholesterolemia, unspecified: Secondary | ICD-10-CM | POA: Diagnosis not present

## 2017-08-26 DIAGNOSIS — Z1159 Encounter for screening for other viral diseases: Secondary | ICD-10-CM | POA: Diagnosis not present

## 2017-09-02 MED FILL — LEVOTHYROXINE 125 MCG TAB: 125 | 90 days supply | Qty: 90 | Fill #0

## 2017-11-01 MED FILL — ROSUVASTATIN CALCIUM 10 MG: 10 | 90 days supply | Qty: 39 | Fill #2

## 2017-11-29 IMAGING — CR DG FOOT COMPLETE 3+V*L*
3 series · 3 of 3 positions shown · non-contrast
Comparison: Left ankle series from today reported separately.

CLINICAL DATA: 55-year-old female with twisting injury 1 month ago
and continued lateral ankle pain, second through fourth metatarsal
pain and swelling. Initial encounter.

EXAM:
LEFT FOOT - COMPLETE 3+ VIEW

[t foot ap left]
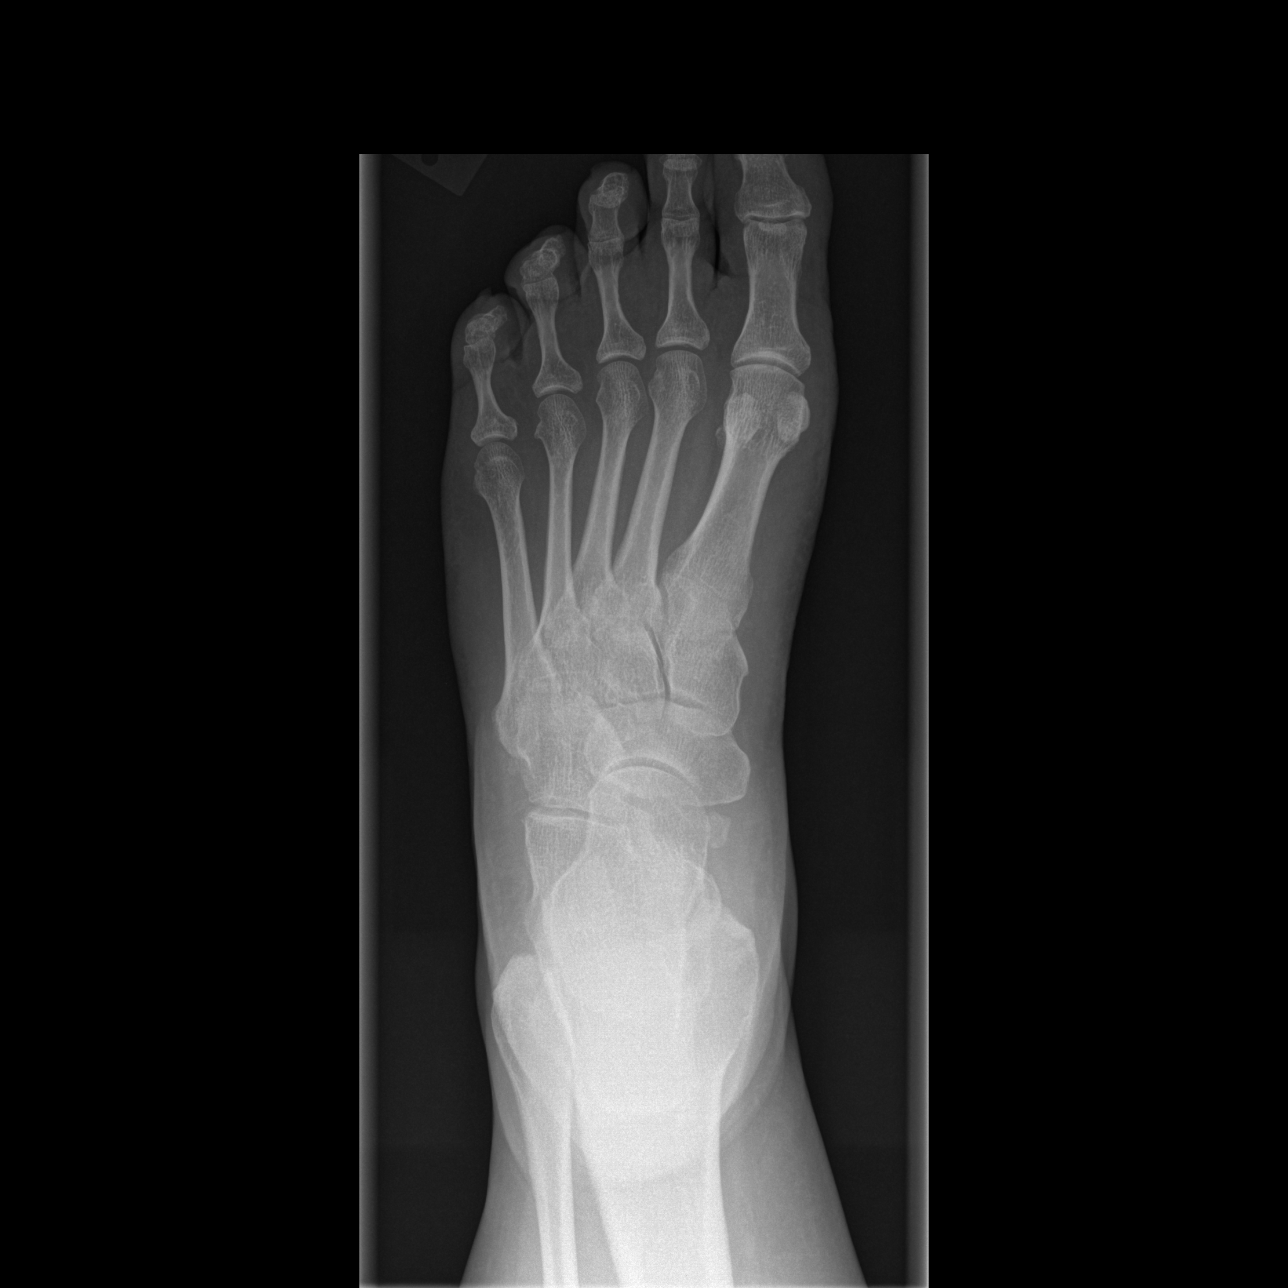

[t foot oblique left]
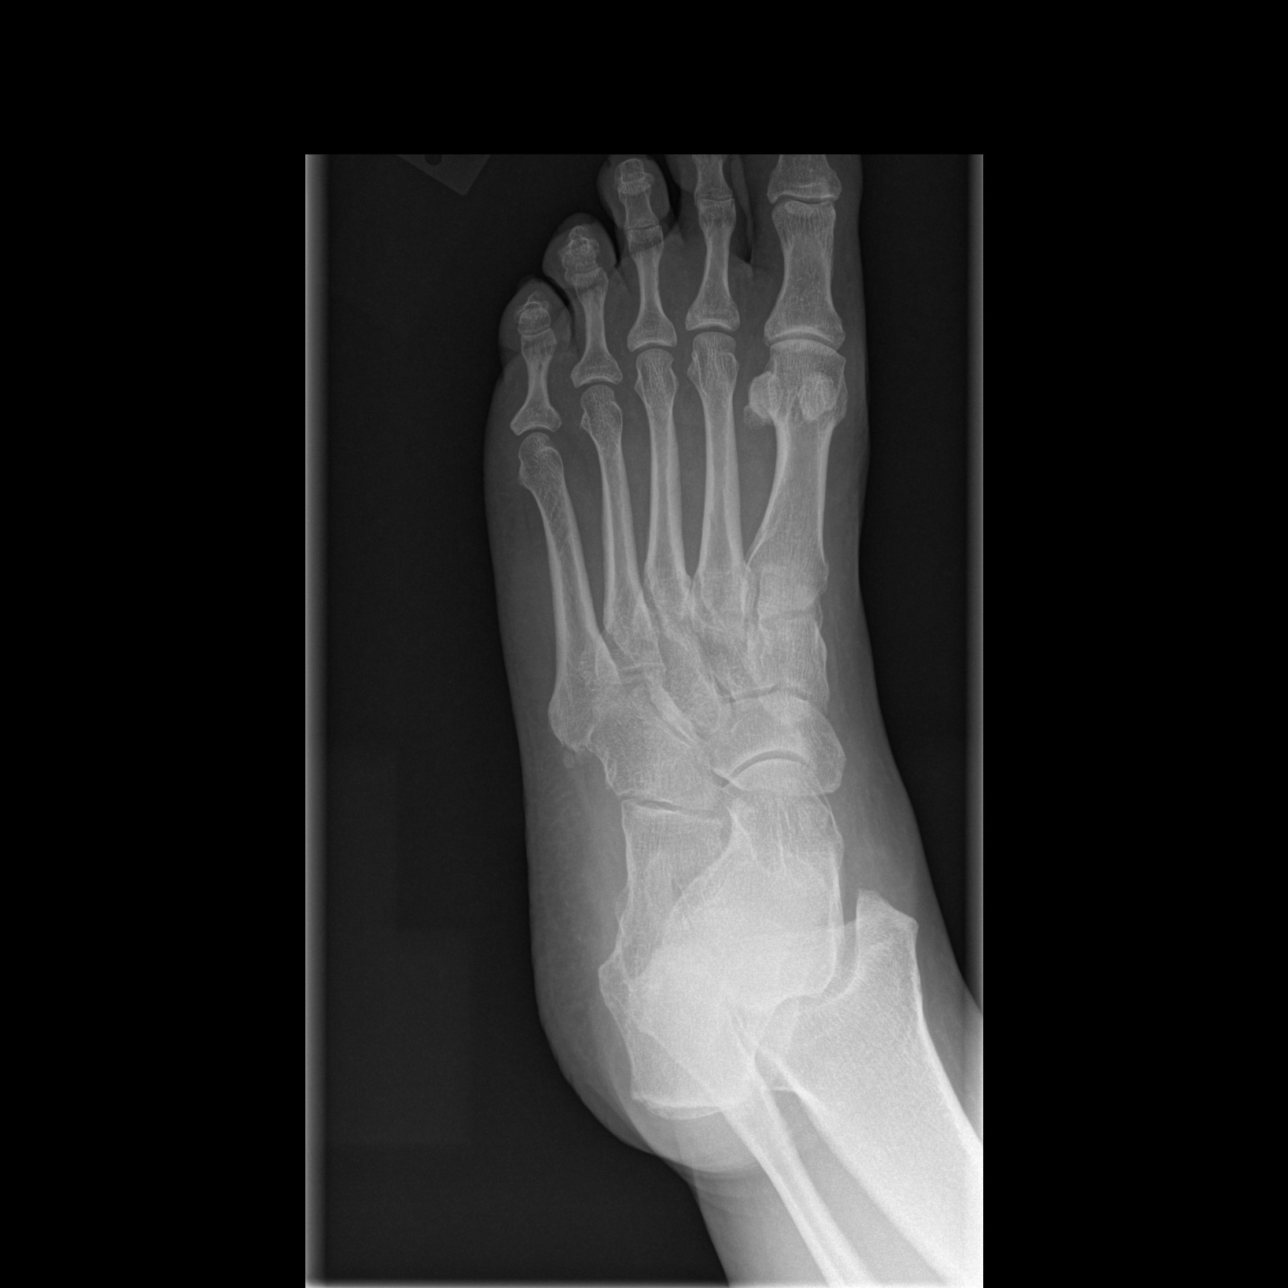

[t foot lat left]
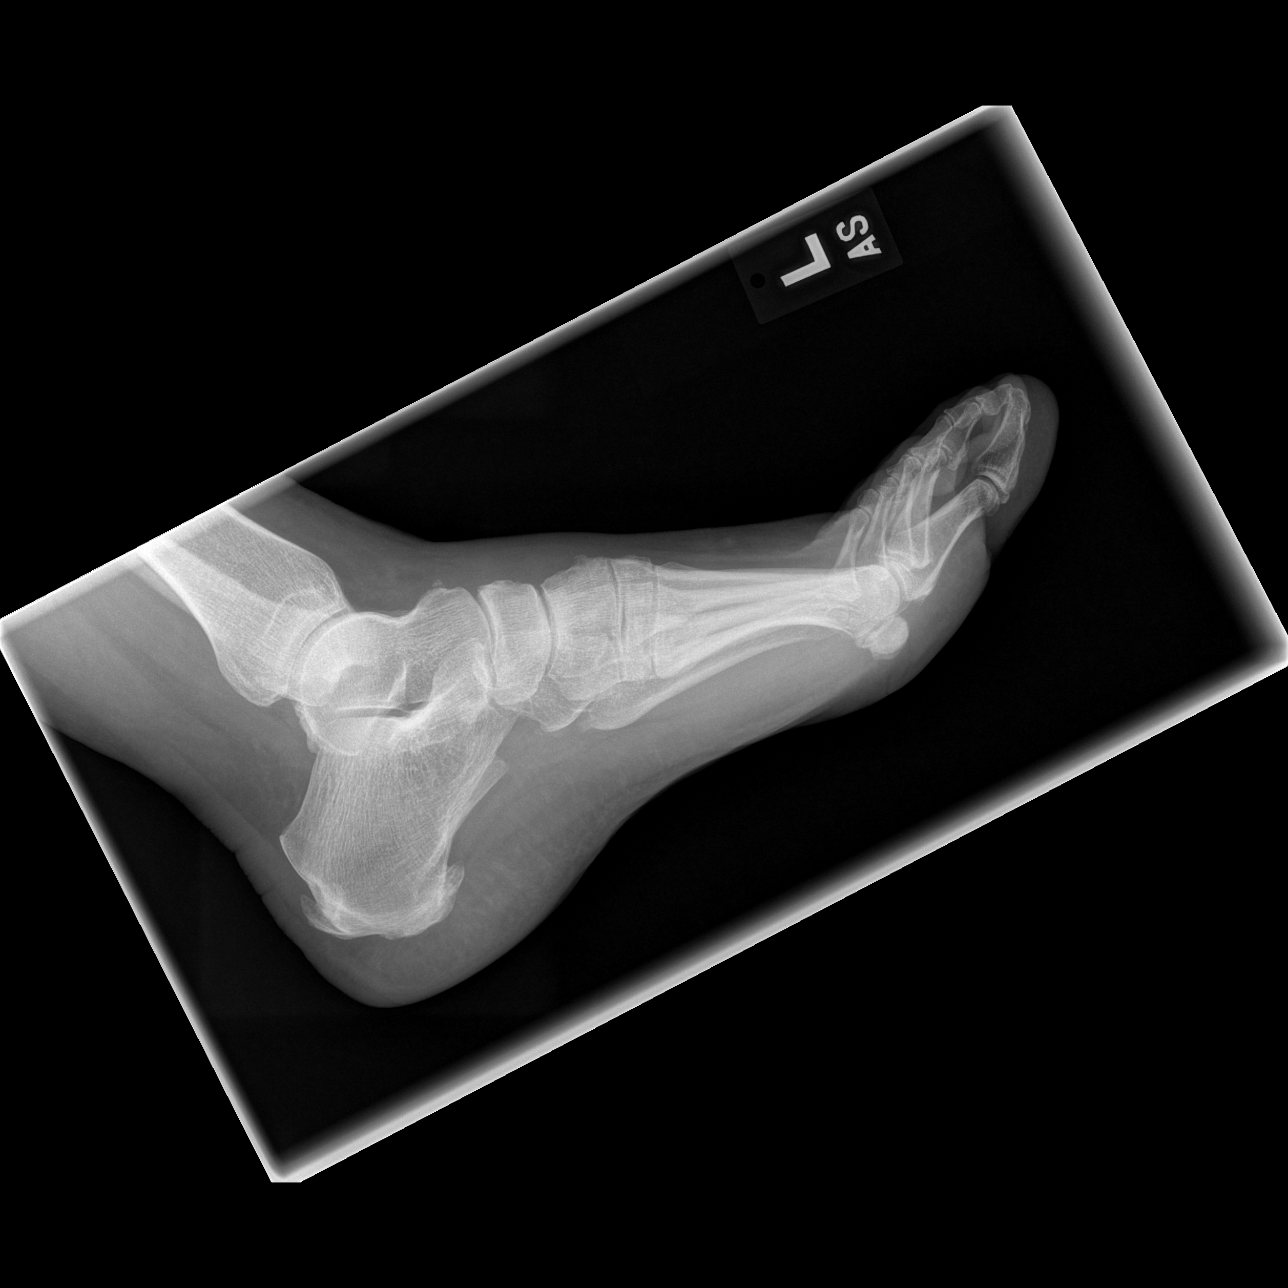

[3 of 3 positions shown; findings below may reference images not displayed]

FINDINGS: Small chronic appearing ossific fragment at the base of the fifth
metatarsal. The metatarsals otherwise appear intact and normally
aligned. MTP joints are normal for age. Phalanges appear intact.
Small ossific fragment along the anterior neck of the talus is
described on the left ankle series today. There is an accessory
ossicle adjacent to the navicular. Otherwise the tarsal bones appear
intact. Mild dorsal degenerative spurring at the distal tarsal
level. No acute osseous abnormality identified.
IMPRESSION: No acute osseous abnormality identified in the left foot.

Small chronic appearing ossific fragment at the base of the fifth
metatarsal, probably from remote trauma.

## 2017-12-02 MED FILL — LEVOTHYROXINE 125 MCG TAB: 125 | 90 days supply | Qty: 90 | Fill #1

## 2018-03-08 MED FILL — LEVOTHYROXINE 125 MCG TAB: 125 | 90 days supply | Qty: 90 | Fill #0

## 2018-03-08 MED FILL — ROSUVASTATIN CALCIUM 10 MG: 10 | 84 days supply | Qty: 36 | Fill #0

## 2018-03-10 DIAGNOSIS — E7800 Pure hypercholesterolemia, unspecified: Secondary | ICD-10-CM | POA: Diagnosis not present

## 2018-03-10 DIAGNOSIS — E039 Hypothyroidism, unspecified: Secondary | ICD-10-CM | POA: Diagnosis not present

## 2018-05-11 ENCOUNTER — Encounter: Payer: Self-pay | Admitting: Gastroenterology

## 2018-06-01 MED FILL — ROSUVASTATIN CALCIUM 10 MG: 10 | 84 days supply | Qty: 36 | Fill #1

## 2018-06-17 MED FILL — LEVOTHYROXINE 125 MCG TAB: 125 | 90 days supply | Qty: 90 | Fill #1

## 2018-07-29 DIAGNOSIS — H52223 Regular astigmatism, bilateral: Secondary | ICD-10-CM | POA: Diagnosis not present

## 2018-07-29 DIAGNOSIS — H524 Presbyopia: Secondary | ICD-10-CM | POA: Diagnosis not present

## 2018-09-21 DIAGNOSIS — E039 Hypothyroidism, unspecified: Secondary | ICD-10-CM | POA: Diagnosis not present

## 2018-09-21 DIAGNOSIS — E7800 Pure hypercholesterolemia, unspecified: Secondary | ICD-10-CM | POA: Diagnosis not present

## 2018-09-21 DIAGNOSIS — Z0000 Encounter for general adult medical examination without abnormal findings: Secondary | ICD-10-CM | POA: Diagnosis not present

## 2018-09-23 MED FILL — ROSUVASTATIN CALCIUM 10 MG: 10 | 42 days supply | Qty: 18 | Fill #2

## 2018-09-30 MED FILL — LEVOTHYROXINE 137 MCG TAB: 137 | 30 days supply | Qty: 30 | Fill #0 | Status: TO

## 2018-11-10 MED FILL — ROSUVASTATIN CALCIUM 10 MG: 10 | 90 days supply | Qty: 40 | Fill #0

## 2018-11-30 DIAGNOSIS — E039 Hypothyroidism, unspecified: Secondary | ICD-10-CM | POA: Diagnosis not present

## 2018-11-30 MED FILL — LEVOTHYROXINE 137 MCG TABLE: 137 | 30 days supply | Qty: 30 | Fill #0

## 2019-01-23 MED FILL — AMOXICILLIN 500 MG CAPSULE: 500 | 7 days supply | Qty: 21 | Fill #0

## 2019-01-24 MED FILL — LEVOTHYROXINE 137 MCG TAB: 137 | 60 days supply | Qty: 60 | Fill #0

## 2019-01-24 MED FILL — ROSUVASTATIN CALCIUM 10 MG: 10 | 90 days supply | Qty: 40 | Fill #0

## 2019-03-22 ENCOUNTER — Other Ambulatory Visit: Payer: Self-pay

## 2019-03-23 ENCOUNTER — Ambulatory Visit (INDEPENDENT_AMBULATORY_CARE_PROVIDER_SITE_OTHER): Payer: 59 | Admitting: Gynecology

## 2019-03-23 ENCOUNTER — Encounter: Payer: Self-pay | Admitting: Gynecology

## 2019-03-23 VITALS — BP 130/72 | Ht 64.25 in | Wt 179.0 lb

## 2019-03-23 DIAGNOSIS — Z1151 Encounter for screening for human papillomavirus (HPV): Secondary | ICD-10-CM | POA: Diagnosis not present

## 2019-03-23 DIAGNOSIS — N952 Postmenopausal atrophic vaginitis: Secondary | ICD-10-CM | POA: Diagnosis not present

## 2019-03-23 DIAGNOSIS — Z01419 Encounter for gynecological examination (general) (routine) without abnormal findings: Secondary | ICD-10-CM | POA: Diagnosis not present

## 2019-03-23 NOTE — Progress Notes (Signed)
    Michelle Finley 1960-09-27 [id number]        58 y.o.  [id number] for annual gynecologic exam.  Without gynecologic complaints  Past medical history,surgical history, problem list, medications, allergies, family history and social history were all reviewed and documented as reviewed in the EPIC chart.  ROS:  Performed with pertinent positives and negatives included in the history, assessment and plan.   Additional significant findings : None   Exam: Copywriter, advertising Vitals:   03/23/19 0806  BP: 130/72  Weight: 179 lb (81.2 kg)  Height: 5' 4.25" (1.632 m)   Body mass index is 30.49 kg/m.  General appearance:  Normal affect, orientation and appearance. Skin: Grossly normal HEENT: Without gross lesions.  No cervical or supraclavicular adenopathy. Thyroid normal.  Lungs:  Clear without wheezing, rales or rhonchi Cardiac: RR, without RMG Abdominal:  Soft, nontender, without masses, guarding, rebound, organomegaly or hernia Breasts:  Examined lying and sitting without masses, retractions, discharge or axillary adenopathy. Pelvic:  Ext, BUS, Vagina: Normal with atrophic changes  Cervix: Normal with atrophic changes  Uterus: Anteverted, normal size, shape and contour, midline and mobile nontender   Adnexa: Without masses or tenderness    Anus and perineum: Normal   Rectovaginal: Normal sphincter tone without palpated masses or tenderness.    Assessment/Plan:  58 y.o. [id number] female for annual gynecologic exam.   1. Postmenopausal/atrophic genital changes.  Without significant menopausal changes.  No bleeding. 2. Mammography 2017.  Reminded patient she is overdue and she agrees to call and schedule.  Breast exam normal today. 3. Pap smear/HPV 2013.  Pap smear/HPV today.  No history of abnormal Pap smears previously. 4. Colonoscopy 2009.  Patient in the process of arranging. 5. DEXA never.  Will plan on scheduling next year after COVID crisis resolving. 6. Health maintenance.  No  routine lab work done as patient does this elsewhere.  Follow-up 1 year, sooner as needed.   [name], 8:34 AM 03/23/2019

## 2019-03-23 NOTE — Patient Instructions (Signed)
Schedule your mammogram  Schedule your colonoscopy  Follow-up in 1 year for annual exam 

## 2019-03-23 NOTE — Addendum Note (Signed)
Addended by: Thurnell Garbe A on: 03/23/2019 08:40 AM   Modules accepted: Orders

## 2019-03-25 LAB — PAP IG AND HPV HIGH-RISK: HPV DNA High Risk: NOT DETECTED

## 2019-03-30 ENCOUNTER — Encounter: Payer: Self-pay | Admitting: Gastroenterology

## 2019-03-30 MED FILL — LEVOTHYROXINE 137 MCG TAB: 137 | 90 days supply | Qty: 90 | Fill #0

## 2019-04-10 ENCOUNTER — Other Ambulatory Visit: Payer: Self-pay | Admitting: Gynecology

## 2019-04-10 DIAGNOSIS — Z1231 Encounter for screening mammogram for malignant neoplasm of breast: Secondary | ICD-10-CM

## 2019-04-11 ENCOUNTER — Encounter: Payer: Self-pay | Admitting: Gynecology

## 2019-04-21 MED FILL — ROSUVASTATIN CALCIUM 10 MG: 10 | 90 days supply | Qty: 40 | Fill #0

## 2019-04-26 ENCOUNTER — Other Ambulatory Visit: Payer: Self-pay

## 2019-04-26 ENCOUNTER — Ambulatory Visit (AMBULATORY_SURGERY_CENTER): Payer: 59 | Admitting: *Deleted

## 2019-04-26 VITALS — Temp 97.3°F | Ht 64.25 in | Wt 181.0 lb

## 2019-04-26 DIAGNOSIS — Z1159 Encounter for screening for other viral diseases: Secondary | ICD-10-CM

## 2019-04-26 DIAGNOSIS — Z1211 Encounter for screening for malignant neoplasm of colon: Secondary | ICD-10-CM

## 2019-04-26 MED ORDER — NA SULFATE-K SULFATE-MG SULF 17.5-3.13-1.6 GM/177ML PO SOLN: ORAL | 0 refills | Status: DC

## 2019-04-26 MED FILL — SUPREP BOWEL PREP KIT: 17.5-3.13-1 | 1 days supply | Qty: 354 | Fill #0

## 2019-04-26 NOTE — Progress Notes (Signed)
Patient is here in-person for PV. Patient denies any allergies to eggs or soy. Patient denies any problems with anesthesia/sedation. Patient denies any oxygen use at home. Patient denies taking any diet/weight loss medications or blood thinners. Patient is not being treated for MRSA or C-diff. EMMI education assisgned to patient on colonoscopy, this was explained and instructions given to patient.   Pt is aware that care partner will wait in the car during procedure; if they feel like they will be too hot to wait in the car; they may wait in the lobby. Patient is aware to bring only one care partner. We want them to wear a mask (we do not have any that we can provide them), practice social distancing, and we will check their temperatures when they get here.  I did remind patient that their care partner needs to stay in the parking lot the entire time. Pt will wear mask into building.  Pt aware covid screening test on 10/23 at 1130am. .

## 2019-04-27 ENCOUNTER — Encounter: Payer: Self-pay | Admitting: Gastroenterology

## 2019-05-05 ENCOUNTER — Telehealth: Payer: Self-pay | Admitting: *Deleted

## 2019-05-05 DIAGNOSIS — Z1159 Encounter for screening for other viral diseases: Secondary | ICD-10-CM | POA: Diagnosis not present

## 2019-05-05 NOTE — Telephone Encounter (Signed)
covid lab order put in again per [facility] Pathology request

## 2019-05-06 LAB — SARS CORONAVIRUS 2 (TAT 6-24 HRS): SARS Coronavirus 2: NEGATIVE

## 2019-05-10 ENCOUNTER — Other Ambulatory Visit: Payer: Self-pay

## 2019-05-10 ENCOUNTER — Encounter: Payer: Self-pay | Admitting: Gastroenterology

## 2019-05-10 ENCOUNTER — Ambulatory Visit (AMBULATORY_SURGERY_CENTER): Payer: 59 | Admitting: Gastroenterology

## 2019-05-10 VITALS — BP 133/76 | HR 53 | Temp 98.0°F | Resp 13 | Ht 64.25 in | Wt 181.0 lb

## 2019-05-10 DIAGNOSIS — Z1211 Encounter for screening for malignant neoplasm of colon: Secondary | ICD-10-CM

## 2019-05-10 DIAGNOSIS — E039 Hypothyroidism, unspecified: Secondary | ICD-10-CM | POA: Diagnosis not present

## 2019-05-10 DIAGNOSIS — E785 Hyperlipidemia, unspecified: Secondary | ICD-10-CM | POA: Diagnosis not present

## 2019-05-10 DIAGNOSIS — K5730 Diverticulosis of large intestine without perforation or abscess without bleeding: Secondary | ICD-10-CM

## 2019-05-10 MED ORDER — SODIUM CHLORIDE 0.9 % IV SOLN: 500.0000 mL | Freq: Once | INTRAVENOUS | Status: DC

## 2019-05-10 NOTE — Op Note (Signed)
[facility] Patient Name: Michelle Finley Procedure Date: 05/10/2019 10:49 AM MRN: [id number] Endoscopist: Milus Banister , MD Age: 58 Referring MD:  Date of Birth: 02-28-1961 Gender: Female Account #: [id number] Procedure:                Colonoscopy Indications:              Screening for colorectal malignant neoplasm Medicines:                Monitored Anesthesia Care Procedure:                Pre-Anesthesia Assessment:                           - Prior to the procedure, a History and Physical                            was performed, and patient medications and                            allergies were reviewed. The patient's tolerance of                            previous anesthesia was also reviewed. The risks                            and benefits of the procedure and the sedation                            options and risks were discussed with the patient.                            All questions were answered, and informed consent                            was obtained. Prior Anticoagulants: The patient has                            taken no previous anticoagulant or antiplatelet                            agents. ASA Grade Assessment: II - A patient with                            mild systemic disease. After reviewing the risks                            and benefits, the patient was deemed in                            satisfactory condition to undergo the procedure.                           After obtaining informed consent, the colonoscope  was passed under direct vision. Throughout the                            procedure, the patient's blood pressure, pulse, and                            oxygen saturations were monitored continuously. The                            Colonoscope was introduced through the anus and                            advanced to the the cecum, identified by                            appendiceal orifice and  ileocecal valve. The                            colonoscopy was performed without difficulty. The                            patient tolerated the procedure well. The quality                            of the bowel preparation was good. The ileocecal                            valve, appendiceal orifice, and rectum were                            photographed. Scope In: 10:53:00 AM Scope Out: 11:02:15 AM Scope Withdrawal Time: 0 hours 7 minutes 8 seconds  Total Procedure Duration: 0 hours 9 minutes 15 seconds  Findings:                 Multiple small-mouthed diverticula were found in                            the left colon.                           The exam was otherwise without abnormality on                            direct and retroflexion views. Complications:            No immediate complications. Estimated blood loss:                            None. Estimated Blood Loss:     Estimated blood loss: none. Impression:               - Diverticulosis in the left colon.                           - The examination was otherwise normal on direct  and retroflexion views.                           - No specimens collected. Recommendation:           - Patient has a contact number available for                            emergencies. The signs and symptoms of potential                            delayed complications were discussed with the                            patient. Return to normal activities tomorrow.                            Written discharge instructions were provided to the                            patient.                           - Resume previous diet.                           - Continue present medications.                           - Repeat colonoscopy in 10 years for screening. Milus Banister, MD 05/10/2019 11:04:48 AM This report has been signed electronically.

## 2019-05-10 NOTE — Patient Instructions (Signed)
HANDOUT PROVIDED ON: DIVERTICULOSIS  YOU MAY RESUME YOUR PREVIOUS DIET AND MEDICATION SCHEDULE.  YOUR NEXT COLONOSCOPY SHOULD BE IN 10 YEARS.  THANKS FOR ALLOWING [place] TO CARE FOR YOU TODAY!!  YOU HAD AN ENDOSCOPIC PROCEDURE TODAY AT [facility]:   Refer to the procedure report that was given to you for any specific questions about what was found during the examination.  If the procedure report does not answer your questions, please call your gastroenterologist to clarify.  If you requested that your care partner not be given the details of your procedure findings, then the procedure report has been included in a sealed envelope for you to review at your convenience later.  YOU SHOULD EXPECT: Some feelings of bloating in the abdomen. Passage of more gas than usual.  Walking can help get rid of the air that was put into your GI tract during the procedure and reduce the bloating. If you had a lower endoscopy (such as a colonoscopy or flexible sigmoidoscopy) you may notice spotting of blood in your stool or on the toilet paper. If you underwent a bowel prep for your procedure, you may not have a normal bowel movement for a few days.  Please Note:  You might notice some irritation and congestion in your nose or some drainage.  This is from the oxygen used during your procedure.  There is no need for concern and it should clear up in a day or so.  SYMPTOMS TO REPORT IMMEDIATELY:   Following lower endoscopy (colonoscopy or flexible sigmoidoscopy):  Excessive amounts of blood in the stool  Significant tenderness or worsening of abdominal pains  Swelling of the abdomen that is new, acute  Fever of 100F or higher  For urgent or emergent issues, a gastroenterologist can be reached at any hour by calling [phone].   DIET:  We do recommend a small meal at first, but then you may proceed to your regular diet.  Drink plenty of fluids but you should avoid alcoholic beverages for 24  hours.  ACTIVITY:  You should plan to take it easy for the rest of today and you should NOT DRIVE or use heavy machinery until tomorrow (because of the sedation medicines used during the test).    FOLLOW UP: Our staff will call the number listed on your records 48-72 hours following your procedure to check on you and address any questions or concerns that you may have regarding the information given to you following your procedure. If we do not reach you, we will leave a message.  We will attempt to reach you two times.  During this call, we will ask if you have developed any symptoms of COVID 19. If you develop any symptoms (ie: fever, flu-like symptoms, shortness of breath, cough etc.) before then, please call [phone].  If you test positive for Covid 19 in the 2 weeks post procedure, please call and report this information to [place].    If any biopsies were taken you will be contacted by phone or by letter within the next 1-3 weeks.  Please call us at [phone] if you have not heard about the biopsies in 3 weeks.    SIGNATURES/CONFIDENTIALITY: You and/or your care partner have signed paperwork which will be entered into your electronic medical record.  These signatures attest to the fact that that the information above on your After Visit Summary has been reviewed and is understood.  Full responsibility of the confidentiality of this discharge information  lies with you and/or your care-partner.

## 2019-05-10 NOTE — Progress Notes (Signed)
Pt's states no medical or surgical changes since previsit or office visit.   Temp-LC  V/S-CW 

## 2019-05-10 NOTE — Progress Notes (Signed)
Report to PACU, RN, vss, BBS= Clear.  

## 2019-05-12 ENCOUNTER — Telehealth: Payer: Self-pay

## 2019-05-12 NOTE — Telephone Encounter (Signed)
Follow up call attempted.  NALM  

## 2019-05-12 NOTE — Telephone Encounter (Signed)
  Follow up Call-  Call back number 05/10/2019  Post procedure Call Back phone  # [phone]  Permission to leave phone message Yes  Some recent data might be hidden     Patient questions:  Do you have a fever, pain , or abdominal swelling? No. Pain Score  0 *  Have you tolerated food without any problems? Yes.    Have you been able to return to your normal activities? Yes.    Do you have any questions about your discharge instructions: Diet   No. Medications  No. Follow up visit  No.  Do you have questions or concerns about your Care? No.  Actions: * If pain score is 4 or above: No action needed, pain <4. 1. Have you developed a fever since your procedure? no  2.   Have you had an respiratory symptoms (SOB or cough) since your procedure? no  3.   Have you tested positive for COVID 19 since your procedure no  4.   Have you had any family members/close contacts diagnosed with the COVID 19 since your procedure?  no   If yes to any of these questions please route to Joylene John, RN and Alphonsa Gin, Therapist, sports.

## 2019-05-25 ENCOUNTER — Other Ambulatory Visit: Payer: Self-pay

## 2019-05-25 ENCOUNTER — Ambulatory Visit
Admission: RE | Admit: 2019-05-25 | Discharge: 2019-05-25 | Disposition: A | Discharge status: Home / Self Care | Payer: 59 | Source: Ambulatory Visit | Attending: Gynecology | Admitting: Gynecology

## 2019-05-25 DIAGNOSIS — Z1231 Encounter for screening mammogram for malignant neoplasm of breast: Secondary | ICD-10-CM

## 2019-07-19 MED FILL — ROSUVASTATIN CALCIUM 10 MG: 10 | 90 days supply | Qty: 40 | Fill #1

## 2019-07-19 MED FILL — LEVOTHYROXINE 137 MCG TAB: 137 | 90 days supply | Qty: 90 | Fill #0

## 2019-08-01 DIAGNOSIS — H524 Presbyopia: Secondary | ICD-10-CM | POA: Diagnosis not present

## 2019-08-01 DIAGNOSIS — H52223 Regular astigmatism, bilateral: Secondary | ICD-10-CM | POA: Diagnosis not present

## 2019-09-14 DIAGNOSIS — Z0000 Encounter for general adult medical examination without abnormal findings: Secondary | ICD-10-CM | POA: Diagnosis not present

## 2019-09-14 DIAGNOSIS — E7800 Pure hypercholesterolemia, unspecified: Secondary | ICD-10-CM | POA: Diagnosis not present

## 2019-09-14 DIAGNOSIS — E039 Hypothyroidism, unspecified: Secondary | ICD-10-CM | POA: Diagnosis not present

## 2019-11-01 ENCOUNTER — Other Ambulatory Visit (HOSPITAL_COMMUNITY): Payer: Self-pay | Admitting: Family Medicine

## 2019-11-01 MED FILL — LEVOTHYROXINE 137 MCG TAB: 137 | 90 days supply | Qty: 90 | Fill #1

## 2019-11-01 MED FILL — ROSUVASTATIN CALCIUM 10 MG: 10 | 90 days supply | Qty: 39 | Fill #0

## 2019-11-28 MED FILL — ROSUVASTATIN CALCIUM 10 MG: 10 | 90 days supply | Qty: 39 | Fill #0

## 2020-02-12 ENCOUNTER — Other Ambulatory Visit (HOSPITAL_COMMUNITY): Payer: Self-pay | Admitting: Family Medicine

## 2020-02-12 MED FILL — LEVOTHYROXINE 137 MCG TAB: 137 | 90 days supply | Qty: 90 | Fill #0

## 2020-02-12 MED FILL — ROSUVASTATIN CALCIUM 10 MG: 10 | 90 days supply | Qty: 39 | Fill #1

## 2020-05-31 MED FILL — ROSUVASTATIN CALCIUM 10 MG: 10 | 90 days supply | Qty: 39 | Fill #2

## 2020-05-31 MED FILL — LEVOTHYROXINE 137 MCG TAB: 137 | 90 days supply | Qty: 90 | Fill #1

## 2020-09-12 ENCOUNTER — Other Ambulatory Visit: Payer: Self-pay | Admitting: Oncology

## 2020-09-12 DIAGNOSIS — Z0000 Encounter for general adult medical examination without abnormal findings: Secondary | ICD-10-CM

## 2020-09-25 ENCOUNTER — Other Ambulatory Visit (HOSPITAL_COMMUNITY): Payer: Self-pay | Admitting: Family Medicine

## 2020-09-25 DIAGNOSIS — Z0000 Encounter for general adult medical examination without abnormal findings: Secondary | ICD-10-CM | POA: Diagnosis not present

## 2020-09-25 DIAGNOSIS — E039 Hypothyroidism, unspecified: Secondary | ICD-10-CM | POA: Diagnosis not present

## 2020-09-25 DIAGNOSIS — E7800 Pure hypercholesterolemia, unspecified: Secondary | ICD-10-CM | POA: Diagnosis not present

## 2020-11-06 IMAGING — MG DIGITAL SCREENING BILAT W/ CAD
4 series · 4 of 4 positions shown · non-contrast
Comparison: Previous exam(s).

ACR Breast Density Category a: The breast tissue is almost entirely
fatty.

CLINICAL DATA: Screening.

EXAM:
DIGITAL SCREENING BILATERAL MAMMOGRAM WITH CAD

[L CC]
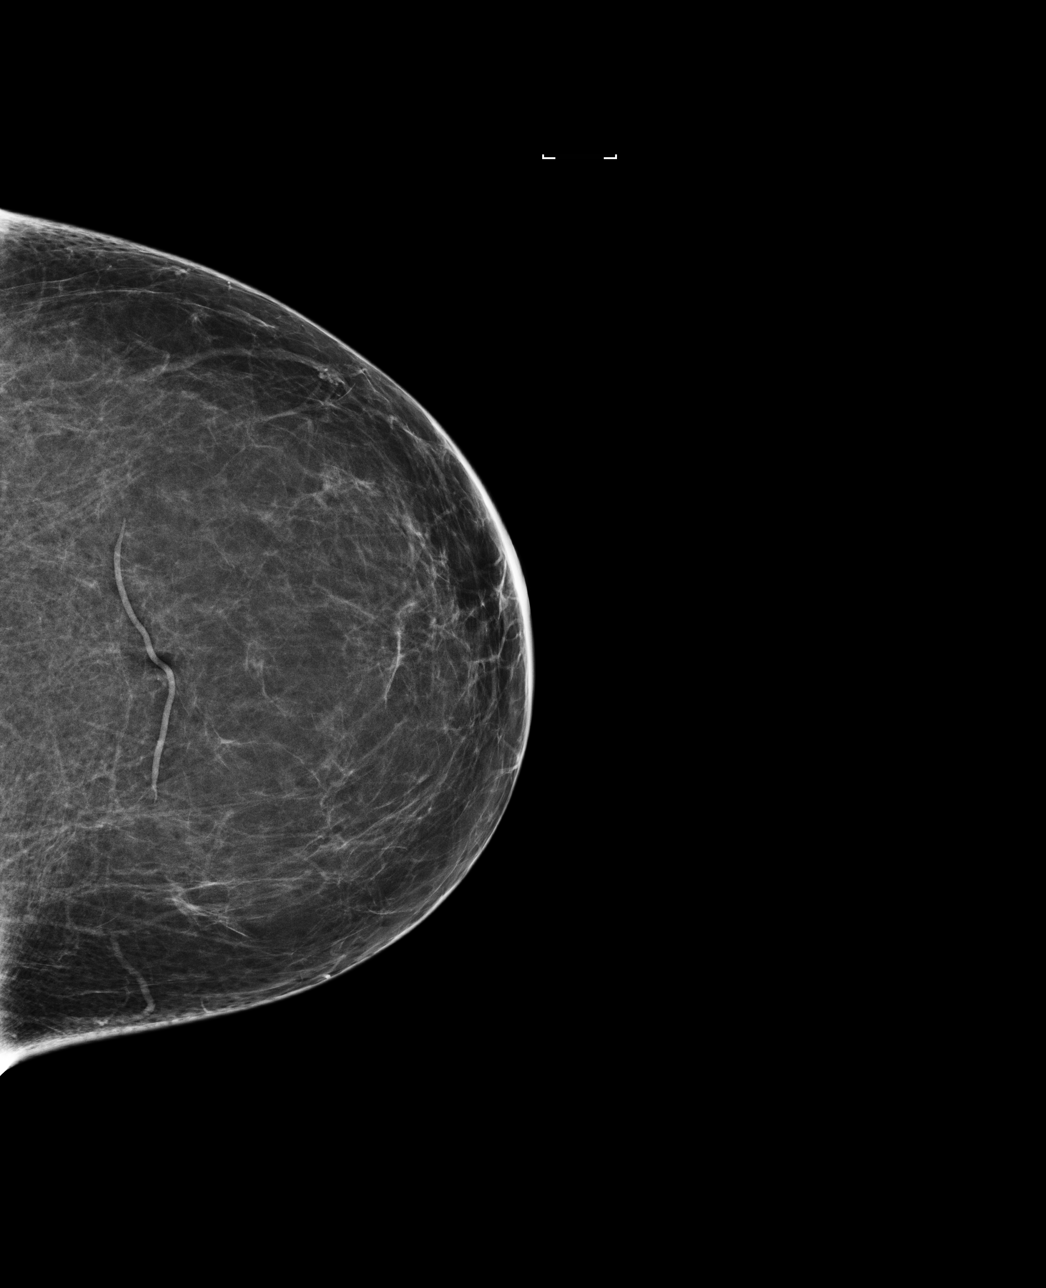

[R MLO]
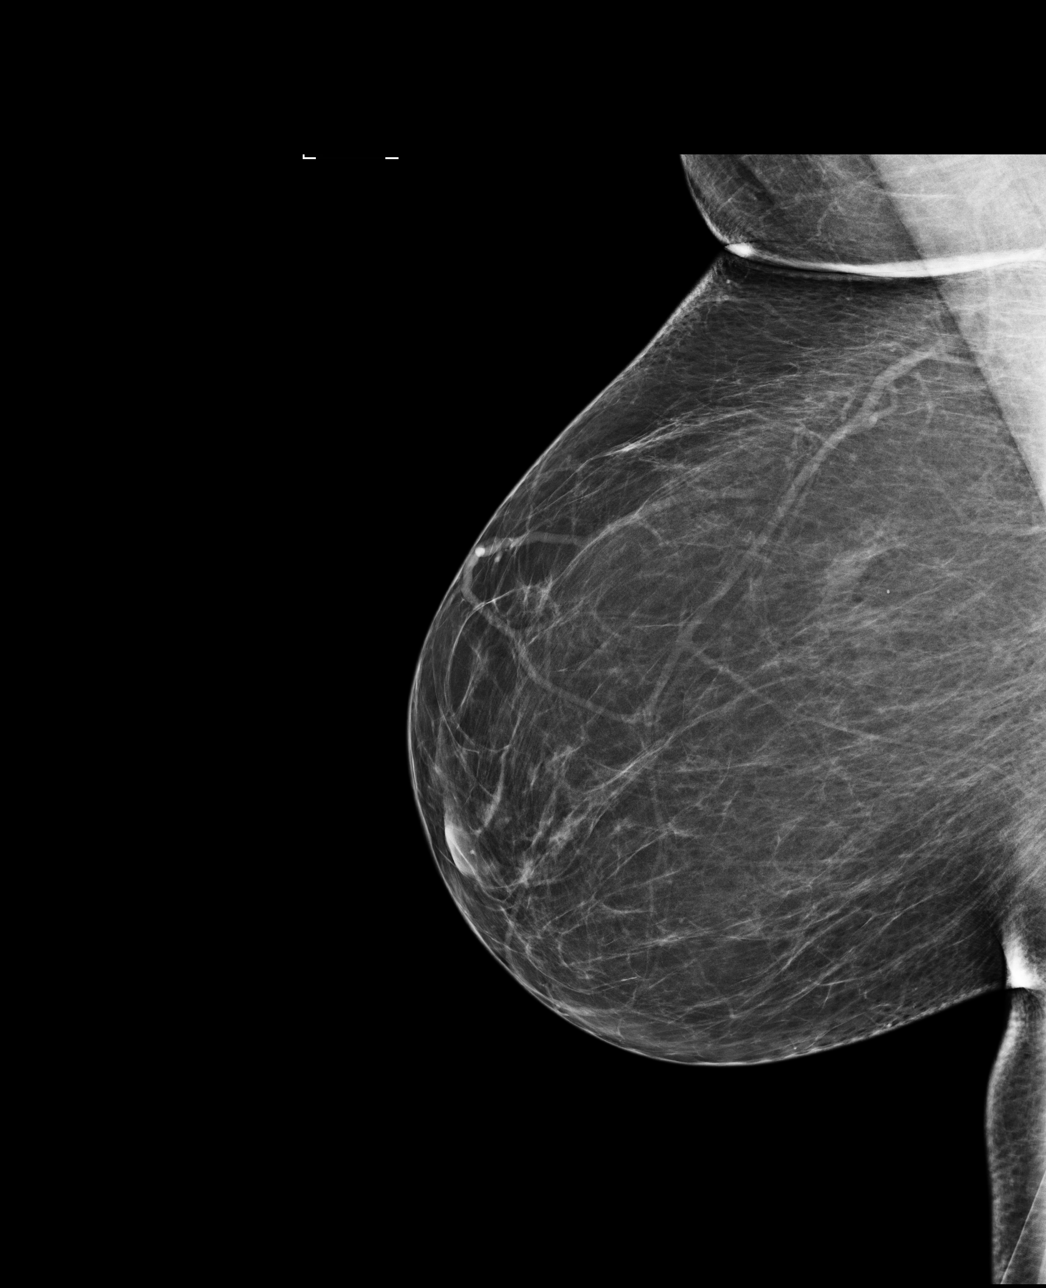

[L MLO]
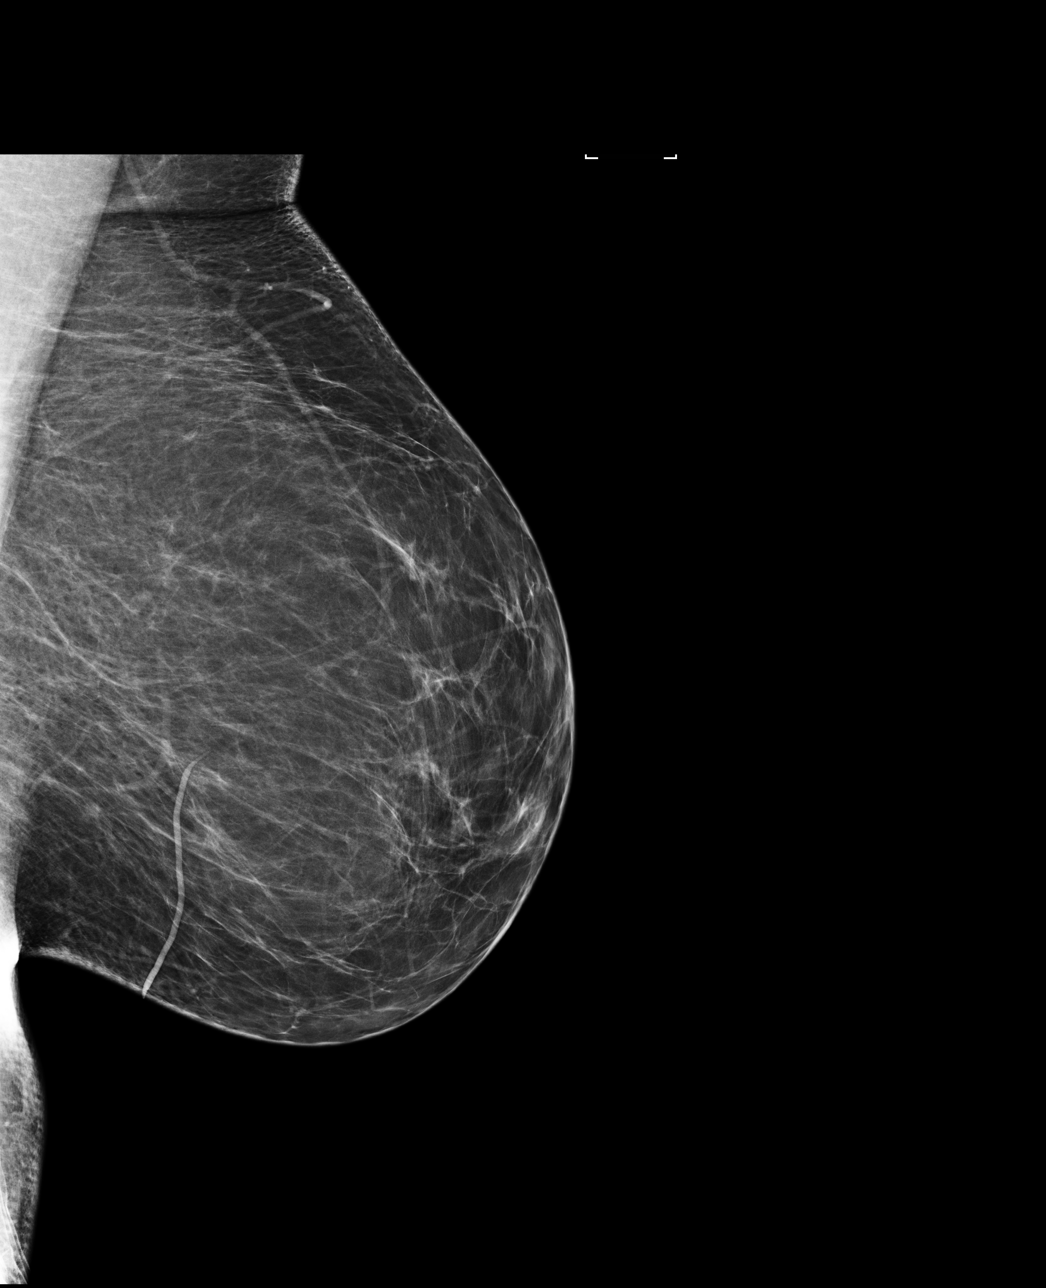

[R CC]
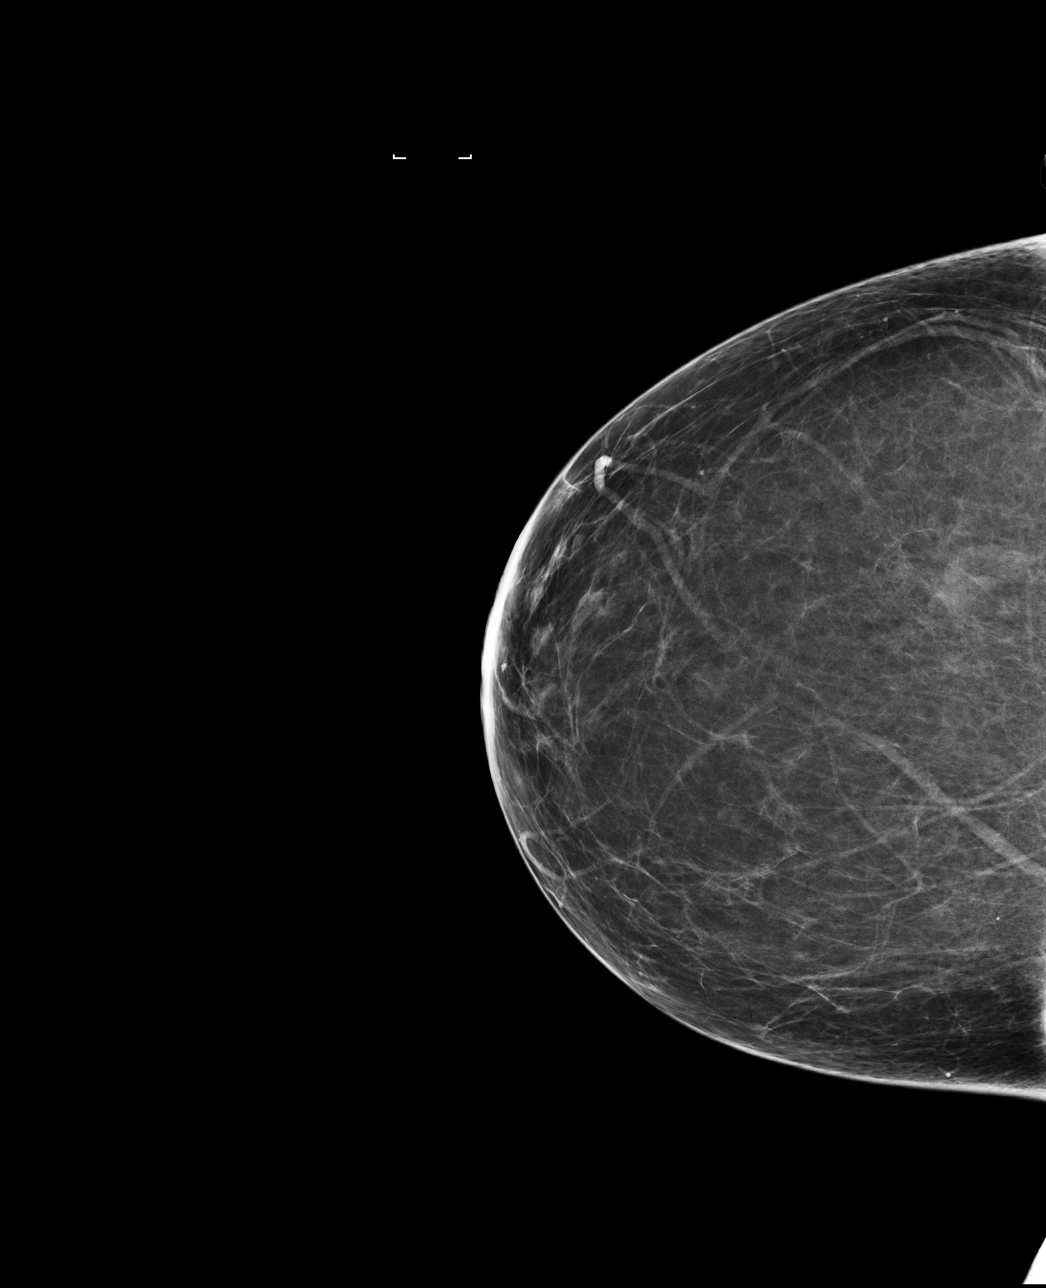

[4 of 4 positions shown; findings below may reference images not displayed]

FINDINGS: There are no findings suspicious for malignancy. Images were
processed with CAD.
IMPRESSION: No mammographic evidence of malignancy. A result letter of this
screening mammogram will be mailed directly to the patient.

RECOMMENDATION:
Screening mammogram in one year. (Code:[id number])

BI-RADS CATEGORY  1: Negative.

## 2020-11-12 DIAGNOSIS — H524 Presbyopia: Secondary | ICD-10-CM | POA: Diagnosis not present

## 2020-11-12 DIAGNOSIS — H2513 Age-related nuclear cataract, bilateral: Secondary | ICD-10-CM | POA: Diagnosis not present

## 2020-11-12 DIAGNOSIS — D4981 Neoplasm of unspecified behavior of retina and choroid: Secondary | ICD-10-CM | POA: Diagnosis not present

## 2020-11-12 DIAGNOSIS — H52223 Regular astigmatism, bilateral: Secondary | ICD-10-CM | POA: Diagnosis not present

## 2020-12-24 ENCOUNTER — Other Ambulatory Visit (HOSPITAL_COMMUNITY): Payer: Self-pay

## 2020-12-24 MED FILL — Rosuvastatin Calcium Tab 10 MG: ORAL | 90 days supply | Qty: 39 | Fill #0 | Status: AC

## 2020-12-24 MED FILL — Rosuvastatin Calcium Tab 10 MG: ORAL | 90 days supply | Qty: 45 | Fill #0 | Status: CN

## 2020-12-24 MED FILL — Levothyroxine Sodium Tab 137 MCG: ORAL | 90 days supply | Qty: 90 | Fill #0 | Status: CN

## 2020-12-24 MED FILL — Levothyroxine Sodium Tab 137 MCG: ORAL | 90 days supply | Qty: 90 | Fill #0 | Status: AC

## 2021-04-15 ENCOUNTER — Other Ambulatory Visit (HOSPITAL_COMMUNITY): Payer: Self-pay

## 2021-04-15 MED FILL — Rosuvastatin Calcium Tab 10 MG: ORAL | 90 days supply | Qty: 39 | Fill #1 | Status: AC

## 2021-04-15 MED FILL — Levothyroxine Sodium Tab 137 MCG: ORAL | 90 days supply | Qty: 90 | Fill #1 | Status: AC

## 2021-07-14 ENCOUNTER — Other Ambulatory Visit (HOSPITAL_COMMUNITY): Payer: Self-pay

## 2021-07-14 MED FILL — Rosuvastatin Calcium Tab 10 MG: ORAL | 90 days supply | Qty: 39 | Fill #2 | Status: AC

## 2021-07-14 MED FILL — Levothyroxine Sodium Tab 137 MCG: ORAL | 90 days supply | Qty: 90 | Fill #2 | Status: AC

## 2021-09-26 DIAGNOSIS — E7800 Pure hypercholesterolemia, unspecified: Secondary | ICD-10-CM | POA: Diagnosis not present

## 2021-09-26 DIAGNOSIS — Z0000 Encounter for general adult medical examination without abnormal findings: Secondary | ICD-10-CM | POA: Diagnosis not present

## 2021-09-26 DIAGNOSIS — Z23 Encounter for immunization: Secondary | ICD-10-CM | POA: Diagnosis not present

## 2021-09-26 DIAGNOSIS — E039 Hypothyroidism, unspecified: Secondary | ICD-10-CM | POA: Diagnosis not present

## 2021-10-28 ENCOUNTER — Other Ambulatory Visit (HOSPITAL_COMMUNITY): Payer: Self-pay

## 2021-10-28 MED ORDER — ROSUVASTATIN CALCIUM 10 MG PO TABS
10.0000 mg | ORAL_TABLET | ORAL | 3 refills | Status: DC
Start: 2021-10-29 — End: 2023-02-16
  Filled 2021-10-28: qty 38, 89d supply, fill #0
  Filled 2022-01-28: qty 38, 89d supply, fill #1
  Filled 2022-04-29: qty 38, 89d supply, fill #2
  Filled 2022-07-28: qty 38, 89d supply, fill #3

## 2021-10-29 ENCOUNTER — Other Ambulatory Visit (HOSPITAL_COMMUNITY): Payer: Self-pay

## 2021-10-30 ENCOUNTER — Other Ambulatory Visit (HOSPITAL_COMMUNITY): Payer: Self-pay

## 2021-10-30 MED ORDER — LEVOTHYROXINE SODIUM 137 MCG PO TABS
137.0000 ug | ORAL_TABLET | Freq: Every day | ORAL | 3 refills | Status: DC
  Filled 2021-10-30: qty 90, 90d supply, fill #0
  Filled 2022-01-28: qty 90, 90d supply, fill #1
  Filled 2022-04-29: qty 90, 90d supply, fill #2
  Filled 2022-07-28: qty 90, 90d supply, fill #3

## 2021-11-14 DIAGNOSIS — H0102A Squamous blepharitis right eye, upper and lower eyelids: Secondary | ICD-10-CM | POA: Diagnosis not present

## 2021-11-14 DIAGNOSIS — H52223 Regular astigmatism, bilateral: Secondary | ICD-10-CM | POA: Diagnosis not present

## 2021-11-14 DIAGNOSIS — H524 Presbyopia: Secondary | ICD-10-CM | POA: Diagnosis not present

## 2021-12-06 ENCOUNTER — Telehealth: Payer: 59 | Admitting: Nurse Practitioner

## 2021-12-06 ENCOUNTER — Other Ambulatory Visit (HOSPITAL_COMMUNITY): Payer: Self-pay

## 2021-12-06 DIAGNOSIS — H5789 Other specified disorders of eye and adnexa: Secondary | ICD-10-CM | POA: Diagnosis not present

## 2021-12-06 MED ORDER — OFLOXACIN 0.3 % OP SOLN
1.0000 [drp] | Freq: Four times a day (QID) | OPHTHALMIC | 0 refills | Status: AC
  Filled 2021-12-06: qty 5, 20d supply, fill #0

## 2021-12-06 NOTE — Progress Notes (Addendum)
Virtual Visit Consent   Michelle Finley, you are scheduled for a virtual visit with [name], [place], a [facility] provider, today.     Just as with appointments in the office, your consent must be obtained to participate.  Your consent will be active for this visit and any virtual visit you may have with one of our providers in the next 365 days.     If you have a MyChart account, a copy of this consent can be sent to you electronically.  All virtual visits are billed to your insurance company just like a traditional visit in the office.    As this is a virtual visit, video technology does not allow for your provider to perform a traditional examination.  This may limit your provider's ability to fully assess your condition.  If your provider identifies any concerns that need to be evaluated in person or the need to arrange testing (such as labs, EKG, etc.), we will make arrangements to do so.     Although advances in technology are sophisticated, we cannot ensure that it will always work on either your end or our end.  If the connection with a video visit is poor, the visit may have to be switched to a telephone visit.  With either a video or telephone visit, we are not always able to ensure that we have a secure connection.     I need to obtain your verbal consent now.   Are you willing to proceed with your visit today? YES   JAMIEE MILHOLLAND has provided verbal consent on 12/06/2021 for a virtual visit (video or telephone).   [name], FNP   Date: 12/06/2021 8:38 AM   Virtual Visit via Video Note   I, [name], connected with Michelle Finley ([id number], [birth year]) on 12/06/21 at  8:45 AM EDT by a video-enabled telemedicine application and verified that I am speaking with the correct person using two identifiers.  Location: Patient: Virtual Visit Location Patient: Home Provider: Virtual Visit Location Provider: Mobile   I discussed the limitations of  evaluation and management by telemedicine and the availability of in person appointments. The patient expressed understanding and agreed to proceed.    History of Present Illness: Michelle Finley is a 61 y.o. who identifies as a female who was assigned female at birth, and is being seen today for red eye.  HPI: Patient states that her left eye has been reed and felt like something was in it for 3 days. She has tried washhing it out. Very watery and mild pain. Denies eyelashed being matted together in ornings.   Review of Systems  Constitutional:  Negative for diaphoresis and weight loss.  Eyes:  Negative for blurred vision, double vision and pain.  Respiratory:  Negative for shortness of breath.   Cardiovascular:  Negative for chest pain, palpitations, orthopnea and leg swelling.  Gastrointestinal:  Negative for abdominal pain.  Skin:  Negative for rash.  Neurological:  Negative for dizziness, sensory change, loss of consciousness, weakness and headaches.  Endo/Heme/Allergies:  Negative for polydipsia. Does not bruise/bleed easily.  Psychiatric/Behavioral:  Negative for memory loss. The patient does not have insomnia.   All other systems reviewed and are negative.  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    Allergies:  Allergies  Allergen Reactions   Morphine And Related Itching   Medications:  Current  Outpatient Medications:    levothyroxine (SYNTHROID) 137 MCG tablet, , Disp: , Rfl:    levothyroxine (SYNTHROID) 137 MCG tablet, TAKE 1 TABLET BY MOUTH DAILY IN THE MORNING ON AN EMPTY STOMACH, Disp: 90 tablet, Rfl: 3   levothyroxine (SYNTHROID) 137 MCG tablet, TAKE 1 TABLET BY MOUTH EVERY MORNING ON AN EMPTY STOMACH, Disp: 90 tablet, Rfl: 1   levothyroxine (SYNTHROID) 137 MCG tablet, Take 1 tablet (137 mcg total) by mouth daily., Disp: 90 tablet, Rfl: 3   rosuvastatin (CRESTOR) 10 MG tablet, Take  10 mg by mouth daily., Disp: , Rfl:    rosuvastatin (CRESTOR) 10 MG tablet, TAKE 1 TABLET BY MOUTH ONCE A DAY ON MONDAY,WEDNESDAY & FRIDAY, Disp: 45 tablet, Rfl: 3   rosuvastatin (CRESTOR) 10 MG tablet, TAKE 1 TABLET BY MOUTH ONCE A DAY ON MONDAY, WEDNESDAY,FRIDAY, Disp: 45 tablet, Rfl: 2   rosuvastatin (CRESTOR) 10 MG tablet, Take 1 tablet (10 mg total) by mouth 3 (three) times a week on Monday, Wednesday and Friday, Disp: 45 tablet, Rfl: 3  Observations/Objective: Patient is well-developed, well-nourished in no acute distress.  Resting comfortably  at home.  Head is normocephalic, atraumatic.  No labored breathing.  Speech is clear and coherent with logical content.  Patient is alert and oriented at baseline.  Left eye mild scleral  injection. No exudate noted  Assessment and Plan:  [name] in today with chief complaint of Conjunctivitis   1. Redness of left eye Use saline eye wash soluton to irrigate eye Avoid rubbing Cool compresses if needed See eye doctor if not improving  Meds ordered this encounter  Medications   ofloxacin (OCUFLOX) 0.3 % ophthalmic solution    Sig: Place 1 drop into the left eye 4 (four) times daily.    Dispense:  5 mL    Refill:  0    Order Specific Question:   Supervising Provider    Answer:   Noemi Chapel [[id number]]      Follow Up Instructions: I discussed the assessment and treatment plan with the patient. The patient was provided an opportunity to ask questions and all were answered. The patient agreed with the plan and demonstrated an understanding of the instructions.  A copy of instructions were sent to the patient via MyChart.  The patient was advised to call back or seek an in-person evaluation if the symptoms worsen or if the condition fails to improve as anticipated.  Time:  I spent 7 minutes with the patient via telehealth technology discussing the above problems/concerns.    [name], FNP

## 2021-12-09 ENCOUNTER — Other Ambulatory Visit (HOSPITAL_COMMUNITY): Payer: Self-pay

## 2021-12-09 DIAGNOSIS — H43813 Vitreous degeneration, bilateral: Secondary | ICD-10-CM | POA: Diagnosis not present

## 2021-12-09 DIAGNOSIS — H2511 Age-related nuclear cataract, right eye: Secondary | ICD-10-CM | POA: Diagnosis not present

## 2021-12-09 DIAGNOSIS — H25812 Combined forms of age-related cataract, left eye: Secondary | ICD-10-CM | POA: Diagnosis not present

## 2021-12-09 DIAGNOSIS — H16292 Other keratoconjunctivitis, left eye: Secondary | ICD-10-CM | POA: Diagnosis not present

## 2021-12-09 MED ORDER — PREDNISOLONE ACETATE 1 % OP SUSP
OPHTHALMIC | 0 refills | Status: AC
Start: 2021-12-09 — End: ?
  Filled 2021-12-09: qty 5, 10d supply, fill #0

## 2021-12-12 DIAGNOSIS — H2511 Age-related nuclear cataract, right eye: Secondary | ICD-10-CM | POA: Diagnosis not present

## 2021-12-12 DIAGNOSIS — H25812 Combined forms of age-related cataract, left eye: Secondary | ICD-10-CM | POA: Diagnosis not present

## 2021-12-12 DIAGNOSIS — H16292 Other keratoconjunctivitis, left eye: Secondary | ICD-10-CM | POA: Diagnosis not present

## 2022-01-28 ENCOUNTER — Other Ambulatory Visit (HOSPITAL_COMMUNITY): Payer: Self-pay

## 2022-01-30 DIAGNOSIS — Z23 Encounter for immunization: Secondary | ICD-10-CM | POA: Diagnosis not present

## 2022-04-29 ENCOUNTER — Other Ambulatory Visit (HOSPITAL_COMMUNITY): Payer: Self-pay

## 2022-09-28 DIAGNOSIS — E039 Hypothyroidism, unspecified: Secondary | ICD-10-CM | POA: Diagnosis not present

## 2022-09-28 DIAGNOSIS — E669 Obesity, unspecified: Secondary | ICD-10-CM | POA: Diagnosis not present

## 2022-09-28 DIAGNOSIS — E7800 Pure hypercholesterolemia, unspecified: Secondary | ICD-10-CM | POA: Diagnosis not present

## 2022-09-28 DIAGNOSIS — Z0000 Encounter for general adult medical examination without abnormal findings: Secondary | ICD-10-CM | POA: Diagnosis not present

## 2022-11-09 ENCOUNTER — Other Ambulatory Visit (HOSPITAL_COMMUNITY): Payer: Self-pay

## 2022-11-09 MED ORDER — LEVOTHYROXINE SODIUM 137 MCG PO TABS
137.0000 ug | ORAL_TABLET | Freq: Every morning | ORAL | 1 refills | Status: DC
  Filled 2022-11-09: qty 90, 90d supply, fill #0
  Filled 2023-02-15: qty 90, 90d supply, fill #1

## 2023-02-15 ENCOUNTER — Other Ambulatory Visit (HOSPITAL_COMMUNITY): Payer: Self-pay

## 2023-02-16 ENCOUNTER — Other Ambulatory Visit (HOSPITAL_COMMUNITY): Payer: Self-pay

## 2023-02-16 MED ORDER — ROSUVASTATIN CALCIUM 10 MG PO TABS
10.0000 mg | ORAL_TABLET | ORAL | 1 refills | Status: DC
  Filled 2023-02-16: qty 36, 84d supply, fill #0
  Filled 2023-05-10: qty 36, 84d supply, fill #1
  Filled 2023-08-24: qty 36, 84d supply, fill #2

## 2023-02-17 ENCOUNTER — Other Ambulatory Visit (HOSPITAL_COMMUNITY): Payer: Self-pay

## 2023-05-10 ENCOUNTER — Other Ambulatory Visit (HOSPITAL_COMMUNITY): Payer: Self-pay

## 2023-05-11 ENCOUNTER — Other Ambulatory Visit (HOSPITAL_COMMUNITY): Payer: Self-pay

## 2023-05-11 MED ORDER — LEVOTHYROXINE SODIUM 137 MCG PO TABS
137.0000 ug | ORAL_TABLET | Freq: Every morning | ORAL | 0 refills | Status: AC
  Filled 2023-05-11: qty 90, 90d supply, fill #0

## 2023-05-14 DIAGNOSIS — E7800 Pure hypercholesterolemia, unspecified: Secondary | ICD-10-CM | POA: Diagnosis not present

## 2023-05-14 DIAGNOSIS — E039 Hypothyroidism, unspecified: Secondary | ICD-10-CM | POA: Diagnosis not present

## 2023-08-24 ENCOUNTER — Other Ambulatory Visit: Payer: Self-pay

## 2023-08-24 ENCOUNTER — Other Ambulatory Visit (HOSPITAL_COMMUNITY): Payer: Self-pay

## 2023-08-24 MED ORDER — ROSUVASTATIN CALCIUM 10 MG PO TABS
10.0000 mg | ORAL_TABLET | ORAL | 1 refills | Status: AC
  Filled 2023-08-24: qty 38, 89d supply, fill #0

## 2023-08-24 MED ORDER — LEVOTHYROXINE SODIUM 137 MCG PO TABS
137.0000 ug | ORAL_TABLET | Freq: Every morning | ORAL | 1 refills | Status: AC
  Filled 2023-08-24: qty 90, 90d supply, fill #0

## 2023-09-03 ENCOUNTER — Other Ambulatory Visit (HOSPITAL_COMMUNITY): Payer: Self-pay

## 2023-09-29 DIAGNOSIS — Z23 Encounter for immunization: Secondary | ICD-10-CM | POA: Diagnosis not present

## 2023-09-29 DIAGNOSIS — Z0000 Encounter for general adult medical examination without abnormal findings: Secondary | ICD-10-CM | POA: Diagnosis not present

## 2023-09-29 DIAGNOSIS — Z1231 Encounter for screening mammogram for malignant neoplasm of breast: Secondary | ICD-10-CM | POA: Diagnosis not present

## 2023-09-29 DIAGNOSIS — E782 Mixed hyperlipidemia: Secondary | ICD-10-CM | POA: Diagnosis not present

## 2023-09-29 DIAGNOSIS — E039 Hypothyroidism, unspecified: Secondary | ICD-10-CM | POA: Diagnosis not present
